# Patient Record
Sex: Female | Born: 1975
Health system: Southern US, Community
[De-identification: ages and names within clinical notes are randomized; demographics above are authoritative.]

## PROBLEM LIST (undated history)

## (undated) DIAGNOSIS — R5383 Other fatigue: Secondary | ICD-10-CM

## (undated) DIAGNOSIS — T8859XA Other complications of anesthesia, initial encounter: Secondary | ICD-10-CM

## (undated) DIAGNOSIS — L409 Psoriasis, unspecified: Secondary | ICD-10-CM

## (undated) DIAGNOSIS — T4145XA Adverse effect of unspecified anesthetic, initial encounter: Secondary | ICD-10-CM

## (undated) DIAGNOSIS — O24419 Gestational diabetes mellitus in pregnancy, unspecified control: Secondary | ICD-10-CM

## (undated) DIAGNOSIS — Z8742 Personal history of other diseases of the female genital tract: Secondary | ICD-10-CM

## (undated) DIAGNOSIS — E282 Polycystic ovarian syndrome: Secondary | ICD-10-CM

## (undated) DIAGNOSIS — N939 Abnormal uterine and vaginal bleeding, unspecified: Secondary | ICD-10-CM

## (undated) DIAGNOSIS — E669 Obesity, unspecified: Secondary | ICD-10-CM

## (undated) DIAGNOSIS — M1990 Unspecified osteoarthritis, unspecified site: Secondary | ICD-10-CM

## (undated) DIAGNOSIS — G971 Other reaction to spinal and lumbar puncture: Secondary | ICD-10-CM

## (undated) DIAGNOSIS — R1032 Left lower quadrant pain: Secondary | ICD-10-CM

## (undated) HISTORY — DX: Psoriasis, unspecified: L40.9

## (undated) HISTORY — DX: Other fatigue: R53.83

## (undated) HISTORY — PX: TONSILLECTOMY: SUR1361

## (undated) HISTORY — DX: Unspecified osteoarthritis, unspecified site: M19.90

## (undated) HISTORY — DX: Left lower quadrant pain: R10.32

## (undated) HISTORY — DX: Polycystic ovarian syndrome: E28.2

## (undated) HISTORY — DX: Personal history of other diseases of the female genital tract: Z87.42

## (undated) HISTORY — DX: Obesity, unspecified: E66.9

## (undated) HISTORY — DX: Abnormal uterine and vaginal bleeding, unspecified: N93.9

---

## 1995-11-12 HISTORY — PX: LAPAROSCOPY FOR ECTOPIC PREGNANCY: SUR765

## 1998-11-11 HISTORY — PX: CARPAL TUNNEL RELEASE: SHX101

## 1999-08-22 ENCOUNTER — Ambulatory Visit (HOSPITAL_BASED_OUTPATIENT_CLINIC_OR_DEPARTMENT_OTHER): Admission: RE | Admit: 1999-08-22 | Discharge: 1999-08-22 | Payer: Self-pay | Admitting: Orthopedic Surgery

## 2001-07-06 ENCOUNTER — Encounter: Payer: Self-pay | Admitting: Internal Medicine

## 2001-07-06 ENCOUNTER — Ambulatory Visit (HOSPITAL_COMMUNITY): Admission: RE | Admit: 2001-07-06 | Discharge: 2001-07-06 | Payer: Self-pay | Admitting: Internal Medicine

## 2001-07-06 ENCOUNTER — Encounter: Payer: Self-pay | Admitting: Emergency Medicine

## 2001-07-06 ENCOUNTER — Emergency Department (HOSPITAL_COMMUNITY): Admission: EM | Admit: 2001-07-06 | Discharge: 2001-07-06 | Payer: Self-pay | Admitting: Emergency Medicine

## 2001-07-08 ENCOUNTER — Ambulatory Visit (HOSPITAL_COMMUNITY): Admission: RE | Admit: 2001-07-08 | Discharge: 2001-07-08 | Payer: Self-pay | Admitting: Internal Medicine

## 2001-07-08 ENCOUNTER — Encounter: Payer: Self-pay | Admitting: Internal Medicine

## 2002-11-05 ENCOUNTER — Encounter: Payer: Self-pay | Admitting: Family Medicine

## 2002-11-05 ENCOUNTER — Ambulatory Visit (HOSPITAL_COMMUNITY): Admission: RE | Admit: 2002-11-05 | Discharge: 2002-11-05 | Payer: Self-pay | Admitting: Family Medicine

## 2003-01-18 ENCOUNTER — Other Ambulatory Visit: Admission: RE | Admit: 2003-01-18 | Discharge: 2003-01-18 | Payer: Self-pay | Admitting: *Deleted

## 2005-11-11 HISTORY — PX: OTHER SURGICAL HISTORY: SHX169

## 2006-09-09 ENCOUNTER — Ambulatory Visit (HOSPITAL_COMMUNITY): Admission: RE | Admit: 2006-09-09 | Discharge: 2006-09-09 | Payer: Self-pay | Admitting: Family Medicine

## 2007-04-27 ENCOUNTER — Ambulatory Visit (HOSPITAL_COMMUNITY): Admission: RE | Admit: 2007-04-27 | Discharge: 2007-04-27 | Payer: Self-pay | Admitting: Family Medicine

## 2007-06-11 ENCOUNTER — Emergency Department (HOSPITAL_COMMUNITY): Admission: EM | Admit: 2007-06-11 | Discharge: 2007-06-11 | Payer: Self-pay | Admitting: Emergency Medicine

## 2007-06-22 ENCOUNTER — Ambulatory Visit (HOSPITAL_COMMUNITY): Admission: RE | Admit: 2007-06-22 | Discharge: 2007-06-22 | Payer: Self-pay | Admitting: Internal Medicine

## 2007-10-13 ENCOUNTER — Encounter: Admission: RE | Admit: 2007-10-13 | Discharge: 2007-10-13 | Payer: Self-pay | Admitting: Rheumatology

## 2008-06-29 ENCOUNTER — Encounter: Admission: RE | Admit: 2008-06-29 | Discharge: 2008-06-29 | Payer: Self-pay | Admitting: Rheumatology

## 2008-09-09 ENCOUNTER — Encounter (INDEPENDENT_AMBULATORY_CARE_PROVIDER_SITE_OTHER): Payer: Self-pay | Admitting: General Surgery

## 2008-09-09 ENCOUNTER — Ambulatory Visit (HOSPITAL_COMMUNITY): Admission: RE | Admit: 2008-09-09 | Discharge: 2008-09-09 | Payer: Self-pay | Admitting: General Surgery

## 2010-09-04 ENCOUNTER — Ambulatory Visit (HOSPITAL_COMMUNITY): Admission: RE | Admit: 2010-09-04 | Discharge: 2010-09-04 | Payer: Self-pay | Admitting: Family Medicine

## 2010-09-10 ENCOUNTER — Encounter (HOSPITAL_COMMUNITY)
Admission: RE | Admit: 2010-09-10 | Discharge: 2010-10-10 | Payer: Self-pay | Source: Home / Self Care | Admitting: Family Medicine

## 2011-03-26 NOTE — H&P (Signed)
NAME:  Emily Love, Emily Love NO.:  [id number]   MEDICAL RECORD NO.:  [id number]          PATIENT TYPE:  AMB   LOCATION:  DAY                           FACILITY:  [facility]   PHYSICIAN:  Tilford Pillar, MD      DATE OF BIRTH:  1976/09/16   DATE OF ADMISSION:  DATE OF DISCHARGE:  LH                              HISTORY & PHYSICAL   CHIEF COMPLAINTS:  Swollen lymph nodes.   HISTORY OF PRESENT ILLNESS:  The patient is a 35 year old female who was  referred to my office after several-month history of nodularity within  her neck.  She first noted this back in May.  She has had no significant  change.  She did state a slight decrease in the largest nodule on the  right side of her neck with the administration of antibiotics, but soon  increased back to its current size following that.  She has had no fever  and chills and she has no significant change in her weight.  She is  easily fatigued but has had similar symptoms in the past associated with  her psoriatic arthritis as well as her suspected rheumatoid arthritis.  She has had no change in bowel or bladder habits.  She has no family  history of any hematologic disorders.  She denied any other nodularities  in axilla or groin.  She has noted no changes in her breast, no masses  that she has noted.   PAST MEDICAL HISTORY:  1. Psoriatic arthritis.  2. Increased rheumatoid factor.   PAST SURGICAL HISTORY:  She has had a carpal tunnel.  She has had 2  cesarean sections.  She has had tonsils and adenoidectomy.   MEDICATIONS:  She is currently on doxycycline and Septra.  She is  prescribed Enbrel and methotrexate, which she has not taken for over a  month secondary to the lymphadenopathy.  She is on methocarbamol 3 times  p.o. daily as needed.   ALLERGIES:  Penicillin, Biaxin, Humira, and Prilosec CR.   SOCIAL HISTORY:  No tobacco, no alcohol use.  No recreational drug use.   OCCUPATION:  She does work as a Land.   PERTINENT FAMILY HISTORY:  Persistent diabetes mellitus and thyroid  disease.  She has had no history of hematologic diseases or cancers.   REVIEW OF SYSTEMS:  CONSTITUTIONAL:  Headaches.  EYES:  Blurred vision, diplopia pain.  EARS, NOSE, AND THROAT:  Unremarkable.  RESPIRATORY:  Unremarkable.  CARDIOVASCULAR:  Unremarkable.  GASTROINTESTINAL:  Indigestion.  GENITOURINARY:  Unremarkable.  MUSCULOSKELETAL:  Arthralgias to the neck, back, and joints.  SKIN:  Rash and pruritus.  To note, she has also noted a increased in  the overlying rash in the posterior aspect of her neck soon after the  first lymph node was noted.  ENDOCRINE:  She complains of increased thirst and lack of energy.  NEUROLOGIC:  Unremarkable.   PHYSICAL EXAMINATION:  GENERAL:  The patient is obese, calm-appearing  female in no acute distress.  She is alert and oriented x3.  HEENT:  Scalp no deformities, no changes with the hair, no  brittleness  of the hair, no scalp masses.  Eyes Pupils are equal, round, reactive.  Extraocular movements are intact.  No conjunctival pallor is noted.  Oral mucosa is pink.  Normal occlusion.  It is moist.  NECK:  Trachea is midline.  No thyroid nodularity or goiters noted.  She  does have cervical adenopathy in the posterior aspect of the right side  of her neck as well as along the upper portions of her posterior neck.  I do not appreciate any significant supraclavicular nodularity.  PULMONARY:  Unlabored respiration.  No wheezes.  She is clear to  auscultation bilaterally.  CARDIOVASCULAR:  Regular rate and rhythm.  No murmurs are apparent.  AXILLA:  On evaluation to the axilla, no axillary lymphadenopathy is  appreciated.  ABDOMEN:  Positive bowel sounds.  Abdomen is soft, nontender.  No  hernias.  No masses.  No groin adenopathy is noted.  SKIN:  Warm and dry.   ASSESSMENT AND PLAN:  Lymphadenopathy.  At this time, my suspicion is  that she has a cancerous  etiology of her enlarged lymph nodes is  relatively low.  However, due to the long duration of these and the lack  of decrease with medication, I did discuss with the patient the  recommendations for biopsy of one of these nodes.  The risks, benefits,  and alternatives of lymph node biopsy were discussed at length with the  patient including bleeding and infection.  Her questions and concerns  were addressed and does wish to proceed.  In addition, the patient has  had a previous exposure to the steroids approximately 2 months ago.  However due to this exposure, I did discuss with the patient that she  will require a steroid taper following the operation.  She understands  and again wishes to proceed.      Tilford Pillar, MD  Electronically Signed     BZ/MEDQ  D:  08/23/2008  T:  08/24/2008  Job:  [id number]   cc:   Corrie Mckusick, M.D.  Fax: [phone]

## 2011-03-26 NOTE — Op Note (Signed)
NAME:  Emily Love, Emily Love NO.:  [id number]   MEDICAL RECORD NO.:  [id number]          PATIENT TYPE:  AMB   LOCATION:  DAY                           FACILITY:  [facility]   PHYSICIAN:  Tilford Pillar, MD      DATE OF BIRTH:  1976-10-15   DATE OF PROCEDURE:  09/09/3008  DATE OF DISCHARGE:                               OPERATIVE REPORT   PREOPERATIVE DIAGNOSIS:  Lymphadenopathy.   POSTOPERATIVE DIAGNOSIS:  Lymphadenopathy.   PROCEDURE:  Right cervical lymph node biopsy via 1.5-cm incision.   SURGEON:  Tilford Pillar, MD   ANESTHESIA:  General via laryngeal mask airway, local anesthetic 0.5%  Marcaine plain.   SPECIMEN:  Lymph node.   ESTIMATED BLOOD LOSS:  Minimal.   INDICATIONS:  The patient is a 35 year old female with a history of  rheumatoid arthritis and psoriasis, who presented with a history of  several nodules in her neck.  These have been present for several months  now with no significant change in size.  Although on evaluation, my  suspicion that these were  malignant in nature, was quite low, the  persistent course did make a lymph node biopsy recommendation.  The  risks, benefits, and alternatives of a lymph node biopsy were discussed  at length with the patient including, but not limited to the risk of  bleeding, infection, and local paresthesia.  The patient's questions and  concerns were addressed.  The patient was consented for the planned  procedure.   OPERATION:  The patient was taken to the operating room and was placed  in supine position on operating room table, at which time the general  anesthesia was administered.  Once the patient was asleep, laryngeal  mask airway was placed by Anesthesia.  At this time, she was positioned  with her right arm retracted inferiorly and her head rotated to the  left.  This allowed adequate exposure of the palpable lymph node.  Chlorhexidine solution was utilized for prep and sterile drapes were  placed.  A  skin incision was created along the line of her hairline with  a scalpel.  Additional dissection down the subcuticular tissue was  carried out using a needle tip electrocautery.  The palpable lymph node  was identified.  This was dissected free.  Additional lymph node was  also identified and this appeared to be somewhat enlarged, but appeared  relatively normal on its gross appearance.  This was sent with the other  specimen.  These were both sent as fresh specimen to Pathology.  Upon  dissecting the pedicle of the lymph node, some bleeding was encountered.  A small vessel was identified.  Electrocautery did not obtain sufficient  hemostasis.  Therefore, a 3-0 silk suture was utilized to suture ligate  the vessels.  At this point, the wound was irrigated.  A 3-0 Vicryl  suture was utilized to reapproximate the deep subcuticular tissue and  then a 4-0 Monocryl was utilized to reapproximate the skin edges in a  running subcuticular suture and then local anesthetic was instilled.  The skin was washed, dried with moist dry towel.  Benzoin was applied  around incision.  Half-inch Steri-Strips were placed.  Drapes were  removed.  The patient was allowed to come out of the general anesthetic  and was transferred back to regular hospital room in a stable condition.   At the conclusion of the procedure all instruments, sponge, and needle  counts were correct.  The patient tolerated the procedure well.      Tilford Pillar, MD  Electronically Signed     BZ/MEDQ  D:  09/09/2008  T:  09/10/2008  Job:  [id number]   cc:   Corrie Mckusick, M.D.  Fax: [phone]

## 2011-06-10 IMAGING — NM NM HEPATO W/GB/PHARM/[PERSON_NAME]
2 series · 12 of 12 positions shown · non-contrast
Comparison: Ultrasound 09/04/2010

CLINICAL DATA: Abdominal pain

NUCLEAR MEDICINE HEPATOBILIARY IMAGING WITH GALLBLADDER EF
TECHNIQUE: Sequential images of the abdomen were obtained [DATE]
minutes following intravenous administration of
radiopharmaceutical.  After slow intravenous infusion of 1.19 ucg
Cholecystokinin, gallbladder ejection fraction was determined.
Radiopharmaceutical:  5.6 mCi Gc-VVm Choletec

[Series 1: gb hepatobiliary scan · 3.19mm/px · 6 of 30 frames shown (1 of 2)]
[frame 3/30]
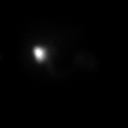
[frame 8/30]
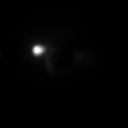
[frame 13/30]
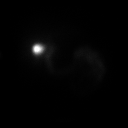
[frame 18/30]
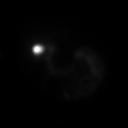
[frame 23/30]
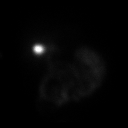
[frame 28/30]
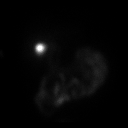

[Series 1: gb hepatobiliary scan · 3.19mm/px · 6 of 60 frames shown (2 of 2)]
[frame 6/60]
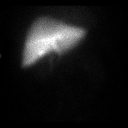
[frame 16/60]
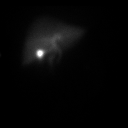
[frame 26/60]
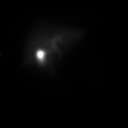
[frame 36/60]
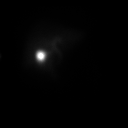
[frame 46/60]
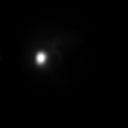
[frame 56/60]
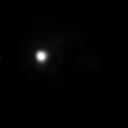

[12 of 12 positions shown; findings below may reference images not displayed]

FINDINGS: There is prompt extraction of radiotracer from the blood
pool and homogeneous uptake within the liver.  The gallbladder is
evident by 15 minutes.  Counts are present within the bowel by 45
minutes.  Upon administration of a cholecystokinin, the gallbladder
contracts appropriately with a calculated ejection fraction = 59%
at 30 min (Normal greater than 30 % ejection).
IMPRESSION: 1. Normal gallbladder ejection fraction =  59%.
2. Patent cystic duct and common bile duct.

## 2011-08-12 LAB — CBC
HCT: 45.5
Hemoglobin: 15.5 — ABNORMAL HIGH
WBC: 10.6 — ABNORMAL HIGH

## 2011-08-12 LAB — BASIC METABOLIC PANEL
BUN: 6
CO2: 29
GFR calc Af Amer: >60

## 2012-03-24 ENCOUNTER — Other Ambulatory Visit (HOSPITAL_COMMUNITY): Payer: Self-pay | Admitting: Internal Medicine

## 2012-03-24 DIAGNOSIS — N643 Galactorrhea not associated with childbirth: Secondary | ICD-10-CM

## 2012-03-27 ENCOUNTER — Ambulatory Visit (HOSPITAL_COMMUNITY)
Admission: RE | Admit: 2012-03-27 | Discharge: 2012-03-27 | Disposition: A | Discharge status: Home / Self Care | Payer: Self-pay | Source: Ambulatory Visit | Attending: Internal Medicine | Admitting: Internal Medicine

## 2012-03-27 DIAGNOSIS — R51 Headache: Secondary | ICD-10-CM | POA: Insufficient documentation

## 2012-03-27 DIAGNOSIS — N643 Galactorrhea not associated with childbirth: Secondary | ICD-10-CM | POA: Insufficient documentation

## 2012-03-27 DIAGNOSIS — H538 Other visual disturbances: Secondary | ICD-10-CM | POA: Insufficient documentation

## 2015-01-26 ENCOUNTER — Encounter: Payer: Self-pay | Admitting: Adult Health

## 2015-01-26 ENCOUNTER — Ambulatory Visit (INDEPENDENT_AMBULATORY_CARE_PROVIDER_SITE_OTHER): Payer: BLUE CROSS/BLUE SHIELD | Admitting: Adult Health

## 2015-01-26 VITALS — BP 128/78 | HR 100 | Ht 61.5 in | Wt 237.0 lb

## 2015-01-26 DIAGNOSIS — R5383 Other fatigue: Secondary | ICD-10-CM

## 2015-01-26 DIAGNOSIS — Z8742 Personal history of other diseases of the female genital tract: Secondary | ICD-10-CM

## 2015-01-26 DIAGNOSIS — N939 Abnormal uterine and vaginal bleeding, unspecified: Secondary | ICD-10-CM | POA: Diagnosis not present

## 2015-01-26 DIAGNOSIS — R1032 Left lower quadrant pain: Secondary | ICD-10-CM | POA: Diagnosis not present

## 2015-01-26 HISTORY — DX: Personal history of other diseases of the female genital tract: Z87.42

## 2015-01-26 HISTORY — DX: Abnormal uterine and vaginal bleeding, unspecified: N93.9

## 2015-01-26 HISTORY — DX: Other fatigue: R53.83

## 2015-01-26 HISTORY — DX: Left lower quadrant pain: R10.32

## 2015-01-26 LAB — POCT URINALYSIS DIPSTICK
GLUCOSE UA: NEGATIVE
LEUKOCYTES UA: NEGATIVE
NITRITE UA: NEGATIVE
PROTEIN UA: NEGATIVE

## 2015-01-26 LAB — POCT URINE PREGNANCY: PREG TEST UR: NEGATIVE

## 2015-01-26 NOTE — Patient Instructions (Signed)
Polycystic Ovarian Syndrome Polycystic ovarian syndrome (PCOS) is a common hormonal disorder among women of reproductive age. Most women with PCOS grow many small cysts on their ovaries. PCOS can cause problems with your periods and make it difficult to get pregnant. It can also cause an increased risk of miscarriage with pregnancy. If left untreated, PCOS can lead to serious health problems, such as diabetes and heart disease. CAUSES The cause of PCOS is not fully understood, but genetics may be a factor. SIGNS AND SYMPTOMS   Infrequent or no menstrual periods.   Inability to get pregnant (infertility) because of not ovulating.   Increased growth of hair on the face, chest, stomach, back, thumbs, thighs, or toes.   Acne, oily skin, or dandruff.   Pelvic pain.   Weight gain or obesity, usually carrying extra weight around the waist.   Type 2 diabetes.   High cholesterol.   High blood pressure.   Female-pattern baldness or thinning hair.   Patches of thickened and dark brown or black skin on the neck, arms, breasts, or thighs.   Tiny excess flaps of skin (skin tags) in the armpits or neck area.   Excessive snoring and having breathing stop at times while asleep (sleep apnea).   Deepening of the voice.   Gestational diabetes when pregnant.  DIAGNOSIS  There is no single test to diagnose PCOS.   Your health care provider will:   Take a medical history.   Perform a pelvic exam.   Have ultrasonography done.   Check your female and female hormone levels.   Measure glucose or sugar levels in the blood.   Do other blood tests.   If you are producing too many female hormones, your health care provider will make sure it is from PCOS. At the physical exam, your health care provider will want to evaluate the areas of increased hair growth. Try to allow natural hair growth for a few days before the visit.   During a pelvic exam, the ovaries may be enlarged  or swollen because of the increased number of small cysts. This can be seen more easily by using vaginal ultrasonography or screening to examine the ovaries and lining of the uterus (endometrium) for cysts. The uterine lining may become thicker if you have not been having a regular period.  TREATMENT  Because there is no cure for PCOS, it needs to be managed to prevent problems. Treatments are based on your symptoms. Treatment is also based on whether you want to have a baby or whether you need contraception.  Treatment may include:   Progesterone hormone to start a menstrual period.   Birth control pills to make you have regular menstrual periods.   Medicines to make you ovulate, if you want to get pregnant.   Medicines to control your insulin.   Medicine to control your blood pressure.   Medicine and diet to control your high cholesterol and triglycerides in your blood.  Medicine to reduce excessive hair growth.  Surgery, making small holes in the ovary, to decrease the amount of female hormone production. This is done through a long, lighted tube (laparoscope) placed into the pelvis through a tiny incision in the lower abdomen.  HOME CARE INSTRUCTIONS  Only take over-the-counter or prescription medicine as directed by your health care provider.  Pay attention to the foods you eat and your activity levels. This can help reduce the effects of PCOS.  Keep your weight under control.  Eat foods that are   low in carbohydrate and high in fiber.  Exercise regularly. SEEK MEDICAL CARE IF:  Your symptoms do not get better with medicine.  You have new symptoms. Document Released: 02/21/2005 Document Revised: 08/18/2013 Document Reviewed: 04/15/2013 [facility] Patient Information 2015 [place]. This information is not intended to replace advice given to you by your health care provider. Make sure you discuss any questions you have with your health care provider. Dysfunctional  Uterine Bleeding Normally, menstrual periods begin between ages 9511 to 5717 in young women. A normal menstrual cycle/period may begin every 23 days up to 35 days and lasts from 1 to 7 days. Around 12 to 14 days before your menstrual period starts, ovulation (ovary produces an egg) occurs. When counting the time between menstrual periods, count from the first day of bleeding of the previous period to the first day of bleeding of the next period. Dysfunctional (abnormal) uterine bleeding is bleeding that is different from a normal menstrual period. Your periods may come earlier or later than usual. They may be lighter, have blood clots or be heavier. You may have bleeding between periods, or you may skip one period or more. You may have bleeding after sexual intercourse, bleeding after menopause, or no menstrual period. CAUSES   Pregnancy (normal, miscarriage, tubal).  IUDs (intrauterine device, birth control).  Birth control pills.  Hormone treatment.  Menopause.  Infection of the cervix.  Blood clotting problems.  Infection of the inside lining of the uterus.  Endometriosis, inside lining of the uterus growing in the pelvis and other female organs.  Adhesions (scar tissue) inside the uterus.  Obesity or severe weight loss.  Uterine polyps inside the uterus.  Cancer of the vagina, cervix, or uterus.  Ovarian cysts or polycystic ovary syndrome.  Medical problems (diabetes, thyroid disease).  Uterine fibroids (noncancerous tumor).  Problems with your female hormones.  Endometrial hyperplasia, very thick lining and enlarged cells inside of the uterus.  Medicines that interfere with ovulation.  Radiation to the pelvis or abdomen.  Chemotherapy. DIAGNOSIS   Your doctor will discuss the history of your menstrual periods, medicines you are taking, changes in your weight, stress in your life, and any medical problems you may have.  Your doctor will do a physical and pelvic  examination.  Your doctor may want to perform certain tests to make a diagnosis, such as:  Pap test.  Blood tests.  Cultures for infection.  CT scan.  Ultrasound.  Hysteroscopy.  Laparoscopy.  MRI.  Hysterosalpingography.  D and C.  Endometrial biopsy. TREATMENT  Treatment will depend on the cause of the dysfunctional uterine bleeding (DUB). Treatment may include:  Observing your menstrual periods for a couple of months.  Prescribing medicines for medical problems, including:  Antibiotics.  Hormones.  Birth control pills.  Removing an IUD (intrauterine device, birth control).  Surgery:  D and C (scrape and remove tissue from inside the uterus).  Laparoscopy (examine inside the abdomen with a lighted tube).  Uterine ablation (destroy lining of the uterus with electrical current, laser, heat, or freezing).  Hysteroscopy (examine cervix and uterus with a lighted tube).  Hysterectomy (remove the uterus). HOME CARE INSTRUCTIONS   If medicines were prescribed, take exactly as directed. Do not change or switch medicines without consulting your caregiver.  Long term heavy bleeding may result in iron deficiency. Your caregiver may have prescribed iron pills. They help replace the iron that your body lost from heavy bleeding. Take exactly as directed.  Do not take aspirin  or medicines that contain aspirin one week before or during your menstrual period. Aspirin may make the bleeding worse.  If you need to change your sanitary pad or tampon more than once every 2 hours, stay in bed with your feet elevated and a cold pack on your lower abdomen. Rest as much as possible, until the bleeding stops or slows down.  Eat well-balanced meals. Eat foods high in iron. Examples are:  Leafy green vegetables.  Whole-grain breads and cereals.  Eggs.  Meat.  Liver.  Do not try to lose weight until the abnormal bleeding has stopped and your blood iron level is back to  normal. Do not lift more than ten pounds or do strenuous activities when you are bleeding.  For a couple of months, make note on your calendar, marking the start and ending of your period, and the type of bleeding (light, medium, heavy, spotting, clots or missed periods). This is for your caregiver to better evaluate your problem. SEEK MEDICAL CARE IF:   You develop nausea (feeling sick to your stomach) and vomiting, dizziness, or diarrhea while you are taking your medicine.  You are getting lightheaded or weak.  You have any problems that may be related to the medicine you are taking.  You develop pain with your DUB.  You want to remove your IUD.  You want to stop or change your birth control pills or hormones.  You have any type of abnormal bleeding mentioned above.  You are over 35 years old and have not had a menstrual period yet.  You are 39 years old and you are still having menstrual periods.  You have any of the symptoms mentioned above.  You develop a rash. SEEK IMMEDIATE MEDICAL CARE IF:   An oral temperature above 102 F (38.9 C) develops.  You develop chills.  You are changing your sanitary pad or tampon more than once an hour.  You develop abdominal pain.  You pass out or faint. Document Released: 10/25/2000 Document Revised: 01/20/2012 Document Reviewed: 09/26/2009 [facility] Patient Information 2015 [place], [place]. This information is not intended to replace advice given to you by your health care provider. Make sure you discuss any questions you have with your health care provider. return next week for [place] and 2 weeks for pap and physical

## 2015-01-26 NOTE — Progress Notes (Signed)
Subjective:     Patient ID: Emily CaterAlicia L Love, female   DOB: Sep 14, 1976, 39 y.o.   MRN: [id number]  HPI Emily Love is a 39 year old white female, married in complaining of bleeding since Friday, has been heavy at times with clots and cramps, but not bleeding now.Has pain LLQ and back.Has history of PCO and last period 2 years ago.Has increased hair growth and hair loss, is hot at night and has fatigue and sex hurts.Has been on OCs in past and stopped and tried metformin and it made her sick.She has 2 boys by C section and has had 1 SAB and 1 ectopic.  Review of Systems Patient denies any daily headaches, hearing loss,  blurred vision, shortness of breath, chest pain, abdominal pain, problems with bowel movements,or urination.Has  joint pains, has arthritis, no  mood swings. See HPI for positives.  Reviewed past medical,surgical, social and family history. Reviewed medications and allergies.     Objective:   Physical Exam BP 128/78 mmHg  Pulse 100  Ht 5' 1.5" (1.562 m)  Wt 237 lb (107.502 kg)  BMI 44.06 kg/m2  LMP 01/20/2015 UPT negative, urine trace blood, Skin warm and dry,has psoriasis.Pelvic: external genitalia is normal in appearance no lesions, vagina: no discharge or blood,urethra has no lesions or masses noted, cervix:smooth, uterus: normal size, shape and contour, mildly tender, no masses felt, adnexa: no masses, LLQ tenderness noted,but some tenderness across pelvic area. . Bladder is non tender and no masses felt. No CVAT but some low back tenderness noted.    Assessment:     LLQ pain  AUB History of PCO Fatigue     Plan:    GC/CGL sent Check CBC,CMP,TSH and FSH Return next week for gyn US Return in 2 weeks for pap and physical Review handout on AUB and PCO.

## 2015-01-27 ENCOUNTER — Telehealth: Payer: Self-pay | Admitting: Adult Health

## 2015-01-27 LAB — COMPREHENSIVE METABOLIC PANEL
A/G RATIO: 2.2 (ref 1.1–2.5)
ALBUMIN: 4.6 g/dL (ref 3.5–5.5)
ALK PHOS: 79 IU/L (ref 39–117)
ALT: 18 IU/L (ref 0–32)
AST: 17 IU/L (ref 0–40)
BILIRUBIN TOTAL: 0.2 mg/dL (ref 0.0–1.2)
BUN/Creatinine Ratio: 13 (ref 8–20)
BUN: 10 mg/dL (ref 6–20)
CALCIUM: 10 mg/dL (ref 8.7–10.2)
CHLORIDE: 101 mmol/L (ref 97–108)
CO2: 23 mmol/L (ref 18–29)
CREATININE: 0.79 mg/dL (ref 0.57–1.00)
GFR calc non Af Amer: 95 mL/min/{1.73_m2} (ref >59)
GFR, EST AFRICAN AMERICAN: 109 mL/min/{1.73_m2} (ref >59)
Globulin, Total: 2.1 g/dL (ref 1.5–4.5)
Glucose: 94 mg/dL (ref 65–99)
POTASSIUM: 4.4 mmol/L (ref 3.5–5.2)
SODIUM: 140 mmol/L (ref 134–144)
TOTAL PROTEIN: 6.7 g/dL (ref 6.0–8.5)

## 2015-01-27 LAB — CBC
HEMATOCRIT: 43.5 % (ref 34.0–46.6)
HEMOGLOBIN: 15 g/dL (ref 11.1–15.9)
MCH: 30.4 pg (ref 26.6–33.0)
MCHC: 34.5 g/dL (ref 31.5–35.7)
MCV: 88 fL (ref 79–97)
Platelets: 332 10*3/uL (ref 150–379)
RBC: 4.94 x10E6/uL (ref 3.77–5.28)
RDW: 14 % (ref 12.3–15.4)
WBC: 13.1 10*3/uL — AB (ref 3.4–10.8)

## 2015-01-27 LAB — TSH: TSH: 2.72 u[IU]/mL (ref 0.450–4.500)

## 2015-01-27 LAB — FOLLICLE STIMULATING HORMONE: FSH: 9.2 m[IU]/mL

## 2015-01-27 NOTE — Telephone Encounter (Signed)
Left message labs normal

## 2015-01-28 LAB — GC/CHLAMYDIA PROBE AMP
Chlamydia trachomatis, NAA: NEGATIVE
Neisseria gonorrhoeae by PCR: NEGATIVE

## 2015-02-01 ENCOUNTER — Other Ambulatory Visit: Payer: Self-pay | Admitting: Adult Health

## 2015-02-01 ENCOUNTER — Ambulatory Visit (INDEPENDENT_AMBULATORY_CARE_PROVIDER_SITE_OTHER): Payer: BLUE CROSS/BLUE SHIELD

## 2015-02-01 DIAGNOSIS — Z8742 Personal history of other diseases of the female genital tract: Secondary | ICD-10-CM

## 2015-02-01 DIAGNOSIS — N939 Abnormal uterine and vaginal bleeding, unspecified: Secondary | ICD-10-CM

## 2015-02-01 DIAGNOSIS — R1032 Left lower quadrant pain: Secondary | ICD-10-CM

## 2015-02-09 ENCOUNTER — Other Ambulatory Visit (HOSPITAL_COMMUNITY)
Admission: RE | Admit: 2015-02-09 | Discharge: 2015-02-09 | Disposition: A | Discharge status: Home / Self Care | Payer: BLUE CROSS/BLUE SHIELD | Source: Ambulatory Visit | Attending: Adult Health | Admitting: Adult Health

## 2015-02-09 ENCOUNTER — Encounter: Payer: Self-pay | Admitting: Adult Health

## 2015-02-09 ENCOUNTER — Ambulatory Visit (INDEPENDENT_AMBULATORY_CARE_PROVIDER_SITE_OTHER): Payer: BLUE CROSS/BLUE SHIELD | Admitting: Adult Health

## 2015-02-09 VITALS — BP 122/78 | HR 108 | Ht 61.5 in | Wt 236.5 lb

## 2015-02-09 DIAGNOSIS — Z01419 Encounter for gynecological examination (general) (routine) without abnormal findings: Secondary | ICD-10-CM

## 2015-02-09 DIAGNOSIS — Z1151 Encounter for screening for human papillomavirus (HPV): Secondary | ICD-10-CM | POA: Diagnosis present

## 2015-02-09 NOTE — Progress Notes (Signed)
Patient ID: Emily Love, female   DOB: January 30, 1976, 39 y.o.   MRN: [id number] History of Present Illness: Emily Love is a 39 year old white female in for well woman gyn exam and pap.She has had AUB and it finally stopped, and the gyn US was normal, no PCO noted.   Current Medications, Allergies, Past Medical History, Past Surgical History, Family History and Social History were [facility].     Review of Systems: Patient denies any headaches, hearing loss, fatigue, blurred vision, shortness of breath, chest pain, abdominal pain, problems with bowel movements, urination, or intercourse. No joint pain or mood swings.Has had LLQ pain and AUB in past.    Physical Exam:BP 122/78 mmHg  Pulse 108  Ht 5' 1.5" (1.562 m)  Wt 236 lb 8 oz (107.276 kg)  BMI 43.97 kg/m2  LMP 01/20/2015 General:  Well developed, well nourished, no acute distress Skin:  Warm and dry, has psoriasis  Neck:  Midline trachea, normal thyroid, good ROM, no lymphadenopathy Lungs; Clear to auscultation bilaterally Breast:  No dominant palpable mass, retraction, or nipple discharge, some tenderness right breast  Cardiovascular: Regular rate and rhythm Abdomen:  Soft, non tender, no hepatosplenomegaly Pelvic:  External genitalia is normal in appearance, no lesions.  The vagina is normal in appearance. Urethra has no lesions or masses. The cervix is bulbous. Pap with HPV performed. Uterus is felt to be normal size, shape, and contour, mildly tender.  No adnexal masses or tenderness noted.Bladder is non tender, no masses felt. Extremities/musculoskeletal:  No swelling or varicosities noted, no clubbing or cyanosis Psych:  No mood changes, alert and cooperative,seems happy She says she has bad clots with OCs and does not want to try.Discussed megace or micronor or IUD or ablation as options.  Impression: Well woman gyn exam with pap  Plan: Review handout on endo ablation Call if  bleeding resumes, can try megace  Get mammogram at 39 Physical in 1 year

## 2015-02-09 NOTE — Patient Instructions (Signed)
Get mammogram at 39  Physical yearly

## 2015-02-14 LAB — CYTOLOGY - PAP

## 2016-04-11 DIAGNOSIS — L4050 Arthropathic psoriasis, unspecified: Secondary | ICD-10-CM | POA: Diagnosis not present

## 2016-04-11 DIAGNOSIS — R51 Headache: Secondary | ICD-10-CM | POA: Diagnosis not present

## 2016-04-11 DIAGNOSIS — M3500 Sicca syndrome, unspecified: Secondary | ICD-10-CM | POA: Diagnosis not present

## 2016-04-11 DIAGNOSIS — Z79899 Other long term (current) drug therapy: Secondary | ICD-10-CM | POA: Diagnosis not present

## 2016-08-28 DIAGNOSIS — L4050 Arthropathic psoriasis, unspecified: Secondary | ICD-10-CM | POA: Diagnosis not present

## 2016-08-28 DIAGNOSIS — Z79899 Other long term (current) drug therapy: Secondary | ICD-10-CM | POA: Diagnosis not present

## 2016-08-28 DIAGNOSIS — M545 Low back pain: Secondary | ICD-10-CM | POA: Diagnosis not present

## 2016-08-28 DIAGNOSIS — M549 Dorsalgia, unspecified: Secondary | ICD-10-CM | POA: Diagnosis not present

## 2016-08-28 DIAGNOSIS — M3500 Sicca syndrome, unspecified: Secondary | ICD-10-CM | POA: Diagnosis not present

## 2017-02-26 DIAGNOSIS — L4050 Arthropathic psoriasis, unspecified: Secondary | ICD-10-CM | POA: Diagnosis not present

## 2017-02-26 DIAGNOSIS — Z79899 Other long term (current) drug therapy: Secondary | ICD-10-CM | POA: Diagnosis not present

## 2017-02-26 DIAGNOSIS — M549 Dorsalgia, unspecified: Secondary | ICD-10-CM | POA: Diagnosis not present

## 2017-02-26 DIAGNOSIS — M3500 Sicca syndrome, unspecified: Secondary | ICD-10-CM | POA: Diagnosis not present

## 2017-06-16 ENCOUNTER — Encounter: Payer: Self-pay | Admitting: Adult Health

## 2017-06-16 ENCOUNTER — Ambulatory Visit (INDEPENDENT_AMBULATORY_CARE_PROVIDER_SITE_OTHER): Payer: BLUE CROSS/BLUE SHIELD | Admitting: Adult Health

## 2017-06-16 VITALS — BP 122/80 | HR 100 | Ht 61.5 in | Wt 215.0 lb

## 2017-06-16 DIAGNOSIS — N926 Irregular menstruation, unspecified: Secondary | ICD-10-CM

## 2017-06-16 DIAGNOSIS — Z3201 Encounter for pregnancy test, result positive: Secondary | ICD-10-CM

## 2017-06-16 DIAGNOSIS — O3680X0 Pregnancy with inconclusive fetal viability, not applicable or unspecified: Secondary | ICD-10-CM

## 2017-06-16 DIAGNOSIS — R110 Nausea: Secondary | ICD-10-CM

## 2017-06-16 DIAGNOSIS — O09521 Supervision of elderly multigravida, first trimester: Secondary | ICD-10-CM

## 2017-06-16 DIAGNOSIS — Z3490 Encounter for supervision of normal pregnancy, unspecified, unspecified trimester: Secondary | ICD-10-CM

## 2017-06-16 LAB — POCT URINE PREGNANCY: Preg Test, Ur: POSITIVE — AB

## 2017-06-16 NOTE — Patient Instructions (Signed)
First Trimester of Pregnancy The first trimester of pregnancy is from week 1 until the end of week 13 (months 1 through 3). A week after a sperm fertilizes an egg, the egg will implant on the wall of the uterus. This embryo will begin to develop into a baby. Genes from you and your partner will form the baby. The female genes will determine whether the baby will be a boy or a girl. At 6-8 weeks, the eyes and face will be formed, and the heartbeat can be seen on ultrasound. At the end of 12 weeks, all the baby's organs will be formed. Now that you are pregnant, you will want to do everything you can to have a healthy baby. Two of the most important things are to get good prenatal care and to follow your health care provider's instructions. Prenatal care is all the medical care you receive before the baby's birth. This care will help prevent, find, and treat any problems during the pregnancy and childbirth. Body changes during your first trimester Your body goes through many changes during pregnancy. The changes vary from woman to woman.  You may gain or lose a couple of pounds at first.  You may feel sick to your stomach (nauseous) and you may throw up (vomit). If the vomiting is uncontrollable, call your health care provider.  You may tire easily.  You may develop headaches that can be relieved by medicines. All medicines should be approved by your health care provider.  You may urinate more often. Painful urination may mean you have a bladder infection.  You may develop heartburn as a result of your pregnancy.  You may develop constipation because certain hormones are causing the muscles that push stool through your intestines to slow down.  You may develop hemorrhoids or swollen veins (varicose veins).  Your breasts may begin to grow larger and become tender. Your nipples may stick out more, and the tissue that surrounds them (areola) may become darker.  Your gums may bleed and may be  sensitive to brushing and flossing.  Dark spots or blotches (chloasma, mask of pregnancy) may develop on your face. This will likely fade after the baby is born.  Your menstrual periods will stop.  You may have a loss of appetite.  You may develop cravings for certain kinds of food.  You may have changes in your emotions from day to day, such as being excited to be pregnant or being concerned that something may go wrong with the pregnancy and baby.  You may have more vivid and strange dreams.  You may have changes in your hair. These can include thickening of your hair, rapid growth, and changes in texture. Some women also have hair loss during or after pregnancy, or hair that feels dry or thin. Your hair will most likely return to normal after your baby is born.  What to expect at prenatal visits During a routine prenatal visit:  You will be weighed to make sure you and the baby are growing normally.  Your blood pressure will be taken.  Your abdomen will be measured to track your baby's growth.  The fetal heartbeat will be listened to between weeks 10 and 14 of your pregnancy.  Test results from any previous visits will be discussed.  Your health care provider may ask you:  How you are feeling.  If you are feeling the baby move.  If you have had any abnormal symptoms, such as leaking fluid, bleeding, severe headaches,   or abdominal cramping.  If you are using any tobacco products, including cigarettes, chewing tobacco, and electronic cigarettes.  If you have any questions.  Other tests that may be performed during your first trimester include:  Blood tests to find your blood type and to check for the presence of any previous infections. The tests will also be used to check for low iron levels (anemia) and protein on red blood cells (Rh antibodies). Depending on your risk factors, or if you previously had diabetes during pregnancy, you may have tests to check for high blood  sugar that affects pregnant women (gestational diabetes).  Urine tests to check for infections, diabetes, or protein in the urine.  An ultrasound to confirm the proper growth and development of the baby.  Fetal screens for spinal cord problems (spina bifida) and Down syndrome.  HIV (human immunodeficiency virus) testing. Routine prenatal testing includes screening for HIV, unless you choose not to have this test.  You may need other tests to make sure you and the baby are doing well.  Follow these instructions at home: Medicines  Follow your health care provider's instructions regarding medicine use. Specific medicines may be either safe or unsafe to take during pregnancy.  Take a prenatal vitamin that contains at least 600 micrograms (mcg) of folic acid.  If you develop constipation, try taking a stool softener if your health care provider approves. Eating and drinking  Eat a balanced diet that includes fresh fruits and vegetables, whole grains, good sources of protein such as meat, eggs, or tofu, and low-fat dairy. Your health care provider will help you determine the amount of weight gain that is right for you.  Avoid raw meat and uncooked cheese. These carry germs that can cause birth defects in the baby.  Eating four or five small meals rather than three large meals a day may help relieve nausea and vomiting. If you start to feel nauseous, eating a few soda crackers can be helpful. Drinking liquids between meals, instead of during meals, also seems to help ease nausea and vomiting.  Limit foods that are high in fat and processed sugars, such as fried and sweet foods.  To prevent constipation: ? Eat foods that are high in fiber, such as fresh fruits and vegetables, whole grains, and beans. ? Drink enough fluid to keep your urine clear or pale yellow. Activity  Exercise only as directed by your health care provider. Most women can continue their usual exercise routine during  pregnancy. Try to exercise for 30 minutes at least 5 days a week. Exercising will help you: ? Control your weight. ? Stay in shape. ? Be prepared for labor and delivery.  Experiencing pain or cramping in the lower abdomen or lower back is a good sign that you should stop exercising. Check with your health care provider before continuing with normal exercises.  Try to avoid standing for long periods of time. Move your legs often if you must stand in one place for a long time.  Avoid heavy lifting.  Wear low-heeled shoes and practice good posture.  You may continue to have sex unless your health care provider tells you not to. Relieving pain and discomfort  Wear a good support bra to relieve breast tenderness.  Take warm sitz baths to soothe any pain or discomfort caused by hemorrhoids. Use hemorrhoid cream if your health care provider approves.  Rest with your legs elevated if you have leg cramps or low back pain.  If you develop   varicose veins in your legs, wear support hose. Elevate your feet for 15 minutes, 3-4 times a day. Limit salt in your diet. Prenatal care  Schedule your prenatal visits by the twelfth week of pregnancy. They are usually scheduled monthly at first, then more often in the last 2 months before delivery.  Write down your questions. Take them to your prenatal visits.  Keep all your prenatal visits as told by your health care provider. This is important. Safety  Wear your seat belt at all times when driving.  Make a list of emergency phone numbers, including numbers for family, friends, the hospital, and police and fire departments. General instructions  Ask your health care provider for a referral to a local prenatal education class. Begin classes no later than the beginning of month 6 of your pregnancy.  Ask for help if you have counseling or nutritional needs during pregnancy. Your health care provider can offer advice or refer you to specialists for help  with various needs.  Do not use hot tubs, steam rooms, or saunas.  Do not douche or use tampons or scented sanitary pads.  Do not cross your legs for long periods of time.  Avoid cat litter boxes and soil used by cats. These carry germs that can cause birth defects in the baby and possibly loss of the fetus by miscarriage or stillbirth.  Avoid all smoking, herbs, alcohol, and medicines not prescribed by your health care provider. Chemicals in these products affect the formation and growth of the baby.  Do not use any products that contain nicotine or tobacco, such as cigarettes and e-cigarettes. If you need help quitting, ask your health care provider. You may receive counseling support and other resources to help you quit.  Schedule a dentist appointment. At home, brush your teeth with a soft toothbrush and be gentle when you floss. Contact a health care provider if:  You have dizziness.  You have mild pelvic cramps, pelvic pressure, or nagging pain in the abdominal area.  You have persistent nausea, vomiting, or diarrhea.  You have a bad smelling vaginal discharge.  You have pain when you urinate.  You notice increased swelling in your face, hands, legs, or ankles.  You are exposed to fifth disease or chickenpox.  You are exposed to German measles (rubella) and have never had it. Get help right away if:  You have a fever.  You are leaking fluid from your vagina.  You have spotting or bleeding from your vagina.  You have severe abdominal cramping or pain.  You have rapid weight gain or loss.  You vomit blood or material that looks like coffee grounds.  You develop a severe headache.  You have shortness of breath.  You have any kind of trauma, such as from a fall or a car accident. Summary  The first trimester of pregnancy is from week 1 until the end of week 13 (months 1 through 3).  Your body goes through many changes during pregnancy. The changes vary from  woman to woman.  You will have routine prenatal visits. During those visits, your health care provider will examine you, discuss any test results you may have, and talk with you about how you are feeling. This information is not intended to replace advice given to you by your health care provider. Make sure you discuss any questions you have with your health care provider. Document Released: 10/22/2001 Document Revised: 10/09/2016 Document Reviewed: 10/09/2016 Elsevier Interactive Patient Education  2017 [employer]  

## 2017-06-16 NOTE — Progress Notes (Signed)
Subjective:     Patient ID: Emily Love, female   DOB: 09-Sep-1976, 41 y.o.   MRN: [id number]  HPI Emily Musterlicia is a 41 year old white female in for UPT, has missed a period and had 4+HPTs, and has nausea and cramping.She had loading dose for Altamease Oileraltz first  On June, non since, do not take Taltz or lasix now.She has a 7123 and 42 year old son, and this was not planned. She also has had 2 miscarriages in the past.  PCP is Dr Sherwood GamblerFusco.   Review of Systems +missed period with 4+HPT Cramping  +nausea Reviewed past medical,surgical, social and family history. Reviewed medications and allergies.     Objective:   Physical Exam BP 122/80 (BP Location: Left Arm, Patient Position: Sitting, Cuff Size: Normal)   Pulse 100   Ht 5' 1.5" (1.562 m)   Wt 215 lb (97.5 kg)   LMP 04/11/2017   BMI 39.97 kg/m UPT + about 9+3 weeks by LMP with EDD 01/17/18. Skin warm and dry. Neck: mid line trachea, normal thyroid, good ROM, no lymphadenopathy noted. Lungs: clear to ausculation bilaterally. Cardiovascular: regular rate and rhythm.Abdomen is soft and non tender. PHQ 2 score 0.    Assessment:     1. Positive pregnancy test   2. Pregnancy, unspecified gestational age   103. Encounter to determine fetal viability of pregnancy, single or unspecified fetus   4. Elderly multigravida in first trimester       Plan:    Stop lasix and taltz Return in 2 days for dating US Review handout on first trimester and by [facility] Eat often

## 2017-06-17 ENCOUNTER — Telehealth: Payer: Self-pay | Admitting: *Deleted

## 2017-06-17 NOTE — Telephone Encounter (Signed)
Patient called with complaints of dark spotting only when wiping after using the restroom. Also has urinary frequency but not able to go much, no burning but abdominal pressure near pelvic area. Please advise.

## 2017-06-18 ENCOUNTER — Ambulatory Visit (INDEPENDENT_AMBULATORY_CARE_PROVIDER_SITE_OTHER): Payer: BLUE CROSS/BLUE SHIELD

## 2017-06-18 DIAGNOSIS — O3680X0 Pregnancy with inconclusive fetal viability, not applicable or unspecified: Secondary | ICD-10-CM

## 2017-06-18 NOTE — Telephone Encounter (Signed)
Left message I called and if stil has symptoms call and get appt

## 2017-06-19 ENCOUNTER — Encounter: Payer: Self-pay | Admitting: Radiology

## 2017-06-19 NOTE — Progress Notes (Signed)
US 9+5 wks,single IUP,positive fht 174 bpm,normal ovaries bilat,1.8 x 1.6 x .7 cm subchorionic hemorrhage,crl 23.17 mm,EDD 01/16/2018

## 2017-07-03 ENCOUNTER — Ambulatory Visit (INDEPENDENT_AMBULATORY_CARE_PROVIDER_SITE_OTHER): Payer: BLUE CROSS/BLUE SHIELD | Admitting: Women's Health

## 2017-07-03 ENCOUNTER — Encounter: Payer: Self-pay | Admitting: Women's Health

## 2017-07-03 VITALS — BP 112/64 | HR 91 | Wt 215.0 lb

## 2017-07-03 DIAGNOSIS — O34219 Maternal care for unspecified type scar from previous cesarean delivery: Secondary | ICD-10-CM

## 2017-07-03 DIAGNOSIS — O09521 Supervision of elderly multigravida, first trimester: Secondary | ICD-10-CM

## 2017-07-03 DIAGNOSIS — O09529 Supervision of elderly multigravida, unspecified trimester: Secondary | ICD-10-CM | POA: Insufficient documentation

## 2017-07-03 DIAGNOSIS — Z1389 Encounter for screening for other disorder: Secondary | ICD-10-CM

## 2017-07-03 DIAGNOSIS — O0991 Supervision of high risk pregnancy, unspecified, first trimester: Secondary | ICD-10-CM

## 2017-07-03 DIAGNOSIS — Z3481 Encounter for supervision of other normal pregnancy, first trimester: Secondary | ICD-10-CM | POA: Diagnosis not present

## 2017-07-03 DIAGNOSIS — Z331 Pregnant state, incidental: Secondary | ICD-10-CM

## 2017-07-03 DIAGNOSIS — O09891 Supervision of other high risk pregnancies, first trimester: Secondary | ICD-10-CM

## 2017-07-03 DIAGNOSIS — L409 Psoriasis, unspecified: Secondary | ICD-10-CM

## 2017-07-03 DIAGNOSIS — Z3A11 11 weeks gestation of pregnancy: Secondary | ICD-10-CM

## 2017-07-03 DIAGNOSIS — O0990 Supervision of high risk pregnancy, unspecified, unspecified trimester: Secondary | ICD-10-CM | POA: Insufficient documentation

## 2017-07-03 NOTE — Progress Notes (Signed)
Subjective:  KELLYANN KASPARIAN is a 41 y.o. [id number] Caucasian female at [redacted]w[redacted]d by LMP c/w 9wk u/s, being seen today for her first obstetrical visit.  Her obstetrical history is significant for AMA- has PCOS, thought she couldn't get pregnant, periods have been regular x last year; h/o c/s x 2 for 'small pelvis', then ectopic x 1 and sab x 1. Has psoriasis and psoriatic arthritis, was on Taltz injections-last was in July.  Pregnancy history fully reviewed.  Patient reports some nausea-improving. Denies vb, cramping, uti s/s, abnormal/malodorous vag d/c, or vulvovaginal itching/irritation.  BP 112/64   Pulse 91   Wt 215 lb (97.5 kg)   LMP 04/11/2017 (Exact Date)   BMI 39.97 kg/m   HISTORY: OB History  Gravida Para Term Preterm AB Living  5 2 2   2 2   SAB TAB Ectopic Multiple Live Births  1   1   2     # Outcome Date GA Lbr Len/2nd Weight Sex Delivery Anes PTL Lv  5 Current           4 SAB 1999          3 Ectopic 1998          2 Term 08/02/93   7 lb 7 oz (3.374 kg) M CS-LTranv   LIV  1 Term 05/08/91   7 lb 10 oz (3.459 kg) M CS-LTranv   LIV     Past Medical History:  Diagnosis Date  . Abnormal uterine bleeding (AUB) 01/26/2015  . Arthritis    psoriatic  . Fatigue 01/26/2015  . History of PCOS 01/26/2015  . LLQ pain 01/26/2015  . Obesity   . PCOS (polycystic ovarian syndrome)   . Psoriasis    Past Surgical History:  Procedure Laterality Date  . CARPAL TUNNEL RELEASE Right 2000  . CESAREAN SECTION  [id number]  . endometrial biopsy    . LAPAROSCOPY FOR ECTOPIC PREGNANCY  1997  . lumph node removed  2007  . TONSILLECTOMY     Family History  Problem Relation Age of Onset  . Hypothyroidism Mother   . Diabetes Father   . Heart disease Father   . Other Father        "something wrong with bones"  . Other Maternal Grandfather        brain tumor  . Cancer Paternal Grandmother        liver  . Epilepsy Son   . Narcolepsy Son     Exam   System:     General: Well developed  & nourished, no acute distress   Skin: Warm & dry, normal coloration and turgor, no rashes   Neurologic: Alert & oriented, normal mood   Cardiovascular: Regular rate & rhythm   Respiratory: Effort & rate normal, LCTAB, acyanotic   Abdomen: Soft, non tender   Extremities: normal strength, tone  Thin prep pap smear 02/09/2015, neg w/ -HRHPV FHR: 165 via doppler   Assessment:   Pregnancy: [id number] Patient Active Problem List   Diagnosis Date Noted  . Elderly multigravida in first trimester 06/16/2017  . Pregnancy 06/16/2017  . Positive pregnancy test 06/16/2017  . LLQ pain 01/26/2015  . Abnormal uterine bleeding (AUB) 01/26/2015  . History of PCOS 01/26/2015  . Fatigue 01/26/2015    [redacted]w[redacted]d [id number] New OB visit AMA C/S x 2 PCOS Psoriasis and psoriatic arthritis  Plan:  Initial labs obtained including Informaseq w/ XY analysis Continue prenatal vitamins Problem list reviewed and updated Reviewed n/v relief measures and  warning s/s to report Reviewed recommended weight gain based on pre-gravid BMI Encouraged well-balanced diet Genetic Screening discussed: discussed nt/it and cfDNA d/t AMA, wants cfDNA, per [facility] also needs AFP so will get that at next visit Cystic fibrosis screening discussed declined Ultrasound discussed; fetal survey: requested Follow up in 4 weeks for visit and AFP CCNC completed>not sent, is not applying for preg [facility] Begin taking a 81mg  baby aspirin daily at 12 weeks of pregnancy to decrease risk of preeclampsia during pregnancy   Marge Duncans CNM, [facility] 07/03/2017 11:53 AM

## 2017-07-03 NOTE — Patient Instructions (Addendum)
Begin taking a 81mg  baby aspirin daily at 12 weeks of pregnancy (tomorrow) to decrease risk of preeclampsia during pregnancy   Nausea & Vomiting  Have saltine crackers or pretzels by your bed and eat a few bites before you raise your head out of bed in the morning  Eat small frequent meals throughout the day instead of large meals  Drink plenty of fluids throughout the day to stay hydrated, just don't drink a lot of fluids with your meals.  This can make your stomach fill up faster making you feel sick  Do not brush your teeth right after you eat  Products with real ginger are good for nausea, like ginger ale and ginger hard candy Make sure it says made with real ginger!  Sucking on sour candy like lemon heads is also good for nausea  If your prenatal vitamins make you nauseated, take them at night so you will sleep through the nausea  Sea Bands  If you feel like you need medicine for the nausea & vomiting please let us know  If you are unable to keep any fluids or food down please let us know   Constipation  Drink plenty of fluid, preferably water, throughout the day  Eat foods high in fiber such as fruits, vegetables, and grains  Exercise, such as walking, is a good way to keep your bowels regular  Drink warm fluids, especially warm prune juice, or decaf coffee  Eat a 1/2 cup of real oatmeal (not instant), 1/2 cup applesauce, and 1/2-1 cup warm prune juice every day  If needed, you may take Colace (docusate sodium) stool softener once or twice a day to help keep the stool soft. If you are pregnant, wait until you are out of your first trimester (12-14 weeks of pregnancy)  If you still are having problems with constipation, you may take Miralax once daily as needed to help keep your bowels regular.  If you are pregnant, wait until you are out of your first trimester (12-14 weeks of pregnancy)   First Trimester of Pregnancy The first trimester of pregnancy is from week 1  until the end of week 12 (months 1 through 3). A week after a sperm fertilizes an egg, the egg will implant on the wall of the uterus. This embryo will begin to develop into a baby. Genes from you and your partner are forming the baby. The female genes determine whether the baby is a boy or a girl. At 6-8 weeks, the eyes and face are formed, and the heartbeat can be seen on ultrasound. At the end of 12 weeks, all the baby's organs are formed.  Now that you are pregnant, you will want to do everything you can to have a healthy baby. Two of the most important things are to get good prenatal care and to follow your health care provider's instructions. Prenatal care is all the medical care you receive before the baby's birth. This care will help prevent, find, and treat any problems during the pregnancy and childbirth. BODY CHANGES Your body goes through many changes during pregnancy. The changes vary from woman to woman.   You may gain or lose a couple of pounds at first.  You may feel sick to your stomach (nauseous) and throw up (vomit). If the vomiting is uncontrollable, call your health care provider.  You may tire easily.  You may develop headaches that can be relieved by medicines approved by your health care provider.  You may urinate  more often. Painful urination may mean you have a bladder infection.  You may develop heartburn as a result of your pregnancy.  You may develop constipation because certain hormones are causing the muscles that push waste through your intestines to slow down.  You may develop hemorrhoids or swollen, bulging veins (varicose veins).  Your breasts may begin to grow larger and become tender. Your nipples may stick out more, and the tissue that surrounds them (areola) may become darker.  Your gums may bleed and may be sensitive to brushing and flossing.  Dark spots or blotches (chloasma, mask of pregnancy) may develop on your face. This will likely fade after the  baby is born.  Your menstrual periods will stop.  You may have a loss of appetite.  You may develop cravings for certain kinds of food.  You may have changes in your emotions from day to day, such as being excited to be pregnant or being concerned that something may go wrong with the pregnancy and baby.  You may have more vivid and strange dreams.  You may have changes in your hair. These can include thickening of your hair, rapid growth, and changes in texture. Some women also have hair loss during or after pregnancy, or hair that feels dry or thin. Your hair will most likely return to normal after your baby is born. WHAT TO EXPECT AT YOUR PRENATAL VISITS During a routine prenatal visit:  You will be weighed to make sure you and the baby are growing normally.  Your blood pressure will be taken.  Your abdomen will be measured to track your baby's growth.  The fetal heartbeat will be listened to starting around week 10 or 12 of your pregnancy.  Test results from any previous visits will be discussed. Your health care provider may ask you:  How you are feeling.  If you are feeling the baby move.  If you have had any abnormal symptoms, such as leaking fluid, bleeding, severe headaches, or abdominal cramping.  If you have any questions. Other tests that may be performed during your first trimester include:  Blood tests to find your blood type and to check for the presence of any previous infections. They will also be used to check for low iron levels (anemia) and Rh antibodies. Later in the pregnancy, blood tests for diabetes will be done along with other tests if problems develop.  Urine tests to check for infections, diabetes, or protein in the urine.  An ultrasound to confirm the proper growth and development of the baby.  An amniocentesis to check for possible genetic problems.  Fetal screens for spina bifida and Down syndrome.  You may need other tests to make sure you  and the baby are doing well. HOME CARE INSTRUCTIONS  Medicines  Follow your health care provider's instructions regarding medicine use. Specific medicines may be either safe or unsafe to take during pregnancy.  Take your prenatal vitamins as directed.  If you develop constipation, try taking a stool softener if your health care provider approves. Diet  Eat regular, well-balanced meals. Choose a variety of foods, such as meat or vegetable-based protein, fish, milk and low-fat dairy products, vegetables, fruits, and whole grain breads and cereals. Your health care provider will help you determine the amount of weight gain that is right for you.  Avoid raw meat and uncooked cheese. These carry germs that can cause birth defects in the baby.  Eating four or five small meals rather than three  large meals a day may help relieve nausea and vomiting. If you start to feel nauseous, eating a few soda crackers can be helpful. Drinking liquids between meals instead of during meals also seems to help nausea and vomiting.  If you develop constipation, eat more high-fiber foods, such as fresh vegetables or fruit and whole grains. Drink enough fluids to keep your urine clear or pale yellow. Activity and Exercise  Exercise only as directed by your health care provider. Exercising will help you:  Control your weight.  Stay in shape.  Be prepared for labor and delivery.  Experiencing pain or cramping in the lower abdomen or low back is a good sign that you should stop exercising. Check with your health care provider before continuing normal exercises.  Try to avoid standing for long periods of time. Move your legs often if you must stand in one place for a long time.  Avoid heavy lifting.  Wear low-heeled shoes, and practice good posture.  You may continue to have sex unless your health care provider directs you otherwise. Relief of Pain or Discomfort  Wear a good support bra for breast  tenderness.   Take warm sitz baths to soothe any pain or discomfort caused by hemorrhoids. Use hemorrhoid cream if your health care provider approves.   Rest with your legs elevated if you have leg cramps or low back pain.  If you develop varicose veins in your legs, wear support hose. Elevate your feet for 15 minutes, 3-4 times a day. Limit salt in your diet. Prenatal Care  Schedule your prenatal visits by the twelfth week of pregnancy. They are usually scheduled monthly at first, then more often in the last 2 months before delivery.  Write down your questions. Take them to your prenatal visits.  Keep all your prenatal visits as directed by your health care provider. Safety  Wear your seat belt at all times when driving.  Make a list of emergency phone numbers, including numbers for family, friends, the hospital, and police and fire departments. General Tips  Ask your health care provider for a referral to a local prenatal education class. Begin classes no later than at the beginning of month 6 of your pregnancy.  Ask for help if you have counseling or nutritional needs during pregnancy. Your health care provider can offer advice or refer you to specialists for help with various needs.  Do not use hot tubs, steam rooms, or saunas.  Do not douche or use tampons or scented sanitary pads.  Do not cross your legs for long periods of time.  Avoid cat litter boxes and soil used by cats. These carry germs that can cause birth defects in the baby and possibly loss of the fetus by miscarriage or stillbirth.  Avoid all smoking, herbs, alcohol, and medicines not prescribed by your health care provider. Chemicals in these affect the formation and growth of the baby.  Schedule a dentist appointment. At home, brush your teeth with a soft toothbrush and be gentle when you floss. SEEK MEDICAL CARE IF:   You have dizziness.  You have mild pelvic cramps, pelvic pressure, or nagging pain in  the abdominal area.  You have persistent nausea, vomiting, or diarrhea.  You have a bad smelling vaginal discharge.  You have pain with urination.  You notice increased swelling in your face, hands, legs, or ankles. SEEK IMMEDIATE MEDICAL CARE IF:   You have a fever.  You are leaking fluid from your vagina.  You  have spotting or bleeding from your vagina.  You have severe abdominal cramping or pain.  You have rapid weight gain or loss.  You vomit blood or material that looks like coffee grounds.  You are exposed to German measles and have never had them.  You are exposed to fifth disease or chickenpox.  You develop a severe headache.  You have shortness of breath.  You have any kind of trauma, such as from a fall or a car accident. Document Released: 10/22/2001 Document Revised: 03/14/2014 Document Reviewed: 09/07/2013 Exit[employer] Patient Information 2015 [employer]. This information is not intended to replace advice given to you by your health care provider. Make sure you discuss any questions you have with your health care provider.   

## 2017-07-04 ENCOUNTER — Encounter: Payer: Self-pay | Admitting: Women's Health

## 2017-07-04 DIAGNOSIS — Z2839 Other underimmunization status: Secondary | ICD-10-CM | POA: Insufficient documentation

## 2017-07-04 DIAGNOSIS — Z283 Underimmunization status: Secondary | ICD-10-CM | POA: Insufficient documentation

## 2017-07-04 DIAGNOSIS — O09899 Supervision of other high risk pregnancies, unspecified trimester: Secondary | ICD-10-CM | POA: Insufficient documentation

## 2017-07-04 DIAGNOSIS — O9989 Other specified diseases and conditions complicating pregnancy, childbirth and the puerperium: Secondary | ICD-10-CM

## 2017-07-04 LAB — URINALYSIS, ROUTINE W REFLEX MICROSCOPIC
BILIRUBIN UA: NEGATIVE
Glucose, UA: NEGATIVE
KETONES UA: NEGATIVE
LEUKOCYTES UA: NEGATIVE
Nitrite, UA: NEGATIVE
Protein, UA: NEGATIVE
RBC UA: NEGATIVE
SPEC GRAV UA: 1.005 (ref 1.005–1.030)
Urobilinogen, Ur: 0.2 mg/dL (ref 0.2–1.0)
pH, UA: 8 — ABNORMAL HIGH (ref 5.0–7.5)

## 2017-07-04 LAB — PMP SCREEN PROFILE (10S), URINE
AMPHETAMINE SCREEN URINE: NEGATIVE ng/mL
BARBITURATE SCREEN URINE: NEGATIVE ng/mL
BENZODIAZEPINE SCREEN, URINE: NEGATIVE ng/mL
CANNABINOIDS UR QL SCN: NEGATIVE ng/mL
COCAINE(METAB.)SCREEN, URINE: NEGATIVE ng/mL
Creatinine(Crt), U: 26.2 mg/dL (ref 20.0–300.0)
Methadone Screen, Urine: NEGATIVE ng/mL
OPIATE SCREEN URINE: NEGATIVE ng/mL
OXYCODONE+OXYMORPHONE UR QL SCN: NEGATIVE ng/mL
PHENCYCLIDINE QUANTITATIVE URINE: NEGATIVE ng/mL
Ph of Urine: 7.2 (ref 4.5–8.9)
Propoxyphene Scrn, Ur: NEGATIVE ng/mL

## 2017-07-04 LAB — CBC
HEMATOCRIT: 40.2 % (ref 34.0–46.6)
HEMOGLOBIN: 13.3 g/dL (ref 11.1–15.9)
MCH: 29.2 pg (ref 26.6–33.0)
MCHC: 33.1 g/dL (ref 31.5–35.7)
MCV: 88 fL (ref 79–97)
Platelets: 332 10*3/uL (ref 150–379)
RBC: 4.56 x10E6/uL (ref 3.77–5.28)
RDW: 13.7 % (ref 12.3–15.4)
WBC: 11.2 10*3/uL — ABNORMAL HIGH (ref 3.4–10.8)

## 2017-07-04 LAB — HEPATITIS B SURFACE ANTIGEN: Hepatitis B Surface Ag: NEGATIVE

## 2017-07-04 LAB — ABO/RH: Rh Factor: POSITIVE

## 2017-07-04 LAB — HIV ANTIBODY (ROUTINE TESTING W REFLEX): HIV SCREEN 4TH GENERATION: NONREACTIVE

## 2017-07-04 LAB — RPR: RPR Ser Ql: NONREACTIVE

## 2017-07-04 LAB — VARICELLA ZOSTER ANTIBODY, IGG: Varicella zoster IgG: 2304 index (ref >165)

## 2017-07-04 LAB — MED LIST OPTION NOT SELECTED

## 2017-07-04 LAB — RUBELLA SCREEN: Rubella Antibodies, IGG: <0.9 index — ABNORMAL LOW (ref >0.99)

## 2017-07-04 LAB — ANTIBODY SCREEN: ANTIBODY SCREEN: NEGATIVE

## 2017-07-05 LAB — URINE CULTURE

## 2017-07-05 LAB — GC/CHLAMYDIA PROBE AMP
Chlamydia trachomatis, NAA: NEGATIVE
Neisseria gonorrhoeae by PCR: NEGATIVE

## 2017-07-11 ENCOUNTER — Telehealth: Payer: Self-pay | Admitting: Advanced Practice Midwife

## 2017-07-11 LAB — INFORMASEQ(SM) WITH XY ANALYSIS
Fetal Fraction (%):: 7.4
Gestational Age at Collection: 11.6 weeks
Weight: 215 [lb_av]

## 2017-07-11 NOTE — Telephone Encounter (Signed)
Patient called stating that she would like to know the results of her blood work. Pt states she seen on her mychart that they are ready. Pt states that she will be in and out of her house to day and we are able to leave a message on her cell phone if she does not pick up.

## 2017-07-11 NOTE — Telephone Encounter (Signed)
Pt called requesting results from previous labwork. Informed pt that she was not immune to rubella and that she would be offered a vaccine for that after delivery. Pt also requested results from [facility]. Informed pt that results were normal and showed a female. Pt verbalized understanding

## 2017-07-25 ENCOUNTER — Telehealth: Payer: Self-pay | Admitting: *Deleted

## 2017-07-25 NOTE — Telephone Encounter (Signed)
Called patient to inform her its too early for antibiotic. If not improving after 7-10d from onset of sx, or continuing to worsen- needs appt. Can use mucinex d. Can try humidifier and saline nasal spray for nasal congestion  Regular robitussin, cough drops if cough  Warm salt water gargles if sore throat  Mucinex with lots of water to help you cough up the mucous in your chest if needed  Drink plenty of fluids and stay hydrated!  Wash your hands frequently.   Verbalized understanding. Has appointment next Thursday.

## 2017-07-25 NOTE — Telephone Encounter (Signed)
Patient called with sinus pressure, headache since yesterday.  Taken Sudafed but with no relief, low grade temp 99.2. Wants an antibiotic called in. Please advise. Also wants to take Mucinex but has "mucinex d" that ok?

## 2017-07-31 ENCOUNTER — Ambulatory Visit (INDEPENDENT_AMBULATORY_CARE_PROVIDER_SITE_OTHER): Payer: BLUE CROSS/BLUE SHIELD | Admitting: Advanced Practice Midwife

## 2017-07-31 ENCOUNTER — Encounter: Payer: Self-pay | Admitting: Advanced Practice Midwife

## 2017-07-31 VITALS — BP 130/60 | HR 96 | Wt 214.0 lb

## 2017-07-31 DIAGNOSIS — Z3A15 15 weeks gestation of pregnancy: Secondary | ICD-10-CM

## 2017-07-31 DIAGNOSIS — Z1389 Encounter for screening for other disorder: Secondary | ICD-10-CM

## 2017-07-31 DIAGNOSIS — Z363 Encounter for antenatal screening for malformations: Secondary | ICD-10-CM

## 2017-07-31 DIAGNOSIS — O0992 Supervision of high risk pregnancy, unspecified, second trimester: Secondary | ICD-10-CM

## 2017-07-31 DIAGNOSIS — Z3482 Encounter for supervision of other normal pregnancy, second trimester: Secondary | ICD-10-CM

## 2017-07-31 DIAGNOSIS — Z331 Pregnant state, incidental: Secondary | ICD-10-CM

## 2017-07-31 DIAGNOSIS — O09522 Supervision of elderly multigravida, second trimester: Secondary | ICD-10-CM

## 2017-07-31 LAB — POCT URINALYSIS DIPSTICK
Blood, UA: NEGATIVE
GLUCOSE UA: NEGATIVE
Glucose, UA: NEGATIVE
Ketones, UA: NEGATIVE
NITRITE UA: NEGATIVE
PROTEIN UA: NEGATIVE
Protein, UA: NEGATIVE

## 2017-07-31 MED ORDER — LIDOCAINE 5 % EX PTCH: 1.0000 | MEDICATED_PATCH | CUTANEOUS | 0 refills | Status: DC

## 2017-07-31 MED ORDER — CLINDAMYCIN HCL 300 MG PO CAPS: 300.0000 mg | ORAL_CAPSULE | Freq: Three times a day (TID) | ORAL | 0 refills | Status: DC

## 2017-07-31 MED ORDER — BUTALBITAL-APAP-CAFFEINE 50-325-40 MG PO TABS: 1.0000 | ORAL_TABLET | Freq: Four times a day (QID) | ORAL | 0 refills | Status: DC | PRN

## 2017-07-31 NOTE — Progress Notes (Signed)
Fetal Surveillance Testing today:  doppler   High Risk Pregnancy Diagnosis(es):   AMA  [id number] [redacted]w[redacted]d Estimated Date of Delivery: 01/16/18  Blood pressure 130/60, pulse 96, weight 214 lb (97.1 kg), last menstrual period 04/11/2017.  Urinalysis: Negative   HPI: The patient is being seen today for ongoing management of the above. Today she reports daily HA for the past 2 weeks, has tried the recommend stuff w/o relief. HA is in back of head.  ALso URI sx for  1 week, usually has to take clindamycin  :Lungs w/bil exp wheeze, mild.  Trigger point on middle of upper back along spine. Lidocaine patch placed   BP weight and urine results all reviewed and noted. Patient reports good fetal movement, denies any bleeding and no rupture of membranes symptoms or regular contractions.   Fetal Heart rate:  150 Edema:  no  Patient is without complaints other than noted in her HPI. All questions were answered.  All lab and sonogram results have been reviewed. Comments:  CfDNA normal XY, get AFP today  Assessment:  1.  Pregnancy at [redacted]w[redacted]d,  Estimated Date of Delivery: 01/16/18 :                          2.  AMA                        3.    Medication(s) Plans:  ASA , clindamycin, fioricet,lidoderm patch  Treatment Plan:  Growth [place] q 4 weeks after 20 weeks, Twice wekly NST at 36 weeks, IOL 40 weeks  Return in about 4 weeks (around 08/28/2017) for HROB, [id number]. for appointment for high risk OB care  Meds ordered this encounter  Medications  . aspirin EC 81 MG tablet    Sig: Take 81 mg by mouth daily.  Marland Kitchen MAGNESIUM PO    Sig: Take 500 mg by mouth 2 (two) times daily.  . Coenzyme Q10 (CO Q 10 PO)    Sig: Take 100 mg by mouth 3 (three) times daily.  . Riboflavin (VITAMIN B-2 PO)    Sig: Take 100 mg by mouth 2 (two) times daily.  . pseudoephedrine-guaifenesin (MUCINEX D) 60-600 MG 12 hr tablet    Sig: Take 1 tablet by mouth daily.   Orders Placed This Encounter  Procedures  . US OB  Comp + 14 Wk  . AFP TETRA  . POCT Urinalysis Dipstick

## 2017-07-31 NOTE — Patient Instructions (Signed)

## 2017-08-20 DIAGNOSIS — Z3482 Encounter for supervision of other normal pregnancy, second trimester: Secondary | ICD-10-CM | POA: Diagnosis not present

## 2017-08-25 LAB — AFP TETRA
DIA Mom Value: 1.55
DIA Value (EIA): 220.45 pg/mL
DSR (BY AGE) 1 IN: 41
DSR (SECOND TRIMESTER) 1 IN: 368
Gestational Age: 15.6 WEEKS
MATERNAL AGE AT EDD: 42.8 a
MSAFP MOM: 1.46
MSAFP: 36.3 ng/mL
MSHCG Mom: 0.38
MSHCG: 12839 m[IU]/mL
OSB RISK: 3048
T18 (By Age): 1:158 {titer}
Test Results:: NEGATIVE
WEIGHT: 214 [lb_av]
uE3 Mom: 1.81
uE3 Value: 1.11 ng/mL

## 2017-08-26 ENCOUNTER — Telehealth: Payer: Self-pay | Admitting: Advanced Practice Midwife

## 2017-08-26 NOTE — Telephone Encounter (Signed)
Pt called requesting results of AFP. Informed pt that results were negative.

## 2017-08-29 ENCOUNTER — Ambulatory Visit (INDEPENDENT_AMBULATORY_CARE_PROVIDER_SITE_OTHER): Payer: BLUE CROSS/BLUE SHIELD

## 2017-08-29 ENCOUNTER — Ambulatory Visit (INDEPENDENT_AMBULATORY_CARE_PROVIDER_SITE_OTHER): Payer: BLUE CROSS/BLUE SHIELD | Admitting: Women's Health

## 2017-08-29 ENCOUNTER — Encounter: Payer: Self-pay | Admitting: Women's Health

## 2017-08-29 VITALS — BP 134/76 | HR 92 | Wt 219.0 lb

## 2017-08-29 DIAGNOSIS — O0992 Supervision of high risk pregnancy, unspecified, second trimester: Secondary | ICD-10-CM

## 2017-08-29 DIAGNOSIS — Z363 Encounter for antenatal screening for malformations: Secondary | ICD-10-CM | POA: Diagnosis not present

## 2017-08-29 DIAGNOSIS — Z23 Encounter for immunization: Secondary | ICD-10-CM | POA: Diagnosis not present

## 2017-08-29 DIAGNOSIS — O0990 Supervision of high risk pregnancy, unspecified, unspecified trimester: Secondary | ICD-10-CM

## 2017-08-29 DIAGNOSIS — O09522 Supervision of elderly multigravida, second trimester: Secondary | ICD-10-CM

## 2017-08-29 DIAGNOSIS — Z331 Pregnant state, incidental: Secondary | ICD-10-CM

## 2017-08-29 DIAGNOSIS — O34219 Maternal care for unspecified type scar from previous cesarean delivery: Secondary | ICD-10-CM

## 2017-08-29 DIAGNOSIS — Z1389 Encounter for screening for other disorder: Secondary | ICD-10-CM

## 2017-08-29 DIAGNOSIS — Z3A20 20 weeks gestation of pregnancy: Secondary | ICD-10-CM

## 2017-08-29 LAB — POCT URINALYSIS DIPSTICK
Blood, UA: NEGATIVE
GLUCOSE UA: NEGATIVE
KETONES UA: NEGATIVE
LEUKOCYTES UA: NEGATIVE
Nitrite, UA: NEGATIVE
Protein, UA: NEGATIVE

## 2017-08-29 NOTE — Patient Instructions (Addendum)
[place] Pediatricians/Family Doctors:  [facility] [phone]            [facility] [phone]                 [facility] [phone] (usually not accepting new patients unless you have family there already, you are always welcome to call and ask)       [facility] [phone]       [place] Pediatricians/Family Doctors:   [facility]: [phone]  [facility]: [phone]   Second Trimester of Pregnancy The second trimester is from week 14 through week 27 (months 4 through 6). The second trimester is often a time when you feel your best. Your body has adjusted to being pregnant, and you begin to feel better physically. Usually, morning sickness has lessened or quit completely, you may have more energy, and you may have an increase in appetite. The second trimester is also a time when the fetus is growing rapidly. At the end of the sixth month, the fetus is about 9 inches long and weighs about 1 pounds. You will likely begin to feel the baby move (quickening) between 16 and 20 weeks of pregnancy. Body changes during your second trimester Your body continues to go through many changes during your second trimester. The changes vary from woman to woman.  Your weight will continue to increase. You will notice your lower abdomen bulging out.  You may begin to get stretch marks on your hips, abdomen, and breasts.  You may develop headaches that can be relieved by medicines. The medicines should be approved by your health care provider.  You may urinate more often because the fetus is pressing on your bladder.  You may develop or continue to have heartburn as a result of your pregnancy.  You may develop constipation because certain hormones are causing the muscles that push waste through your intestines to slow down.  You may develop hemorrhoids or swollen, bulging veins (varicose  veins).  You may have back pain. This is caused by: ? Weight gain. ? Pregnancy hormones that are relaxing the joints in your pelvis. ? A shift in weight and the muscles that support your balance.  Your breasts will continue to grow and they will continue to become tender.  Your gums may bleed and may be sensitive to brushing and flossing.  Dark spots or blotches (chloasma, mask of pregnancy) may develop on your face. This will likely fade after the baby is born.  A dark line from your belly button to the pubic area (linea nigra) may appear. This will likely fade after the baby is born.  You may have changes in your hair. These can include thickening of your hair, rapid growth, and changes in texture. Some women also have hair loss during or after pregnancy, or hair that feels dry or thin. Your hair will most likely return to normal after your baby is born.  What to expect at prenatal visits During a routine prenatal visit:  You will be weighed to make sure you and the fetus are growing normally.  Your blood pressure will be taken.  Your abdomen will be measured to track your baby's growth.  The fetal heartbeat will be listened to.  Any test results from the previous visit will be discussed.  Your health care provider may ask you:  How you are feeling.  If you are feeling the baby move.  If you have had any abnormal symptoms, such   as leaking fluid, bleeding, severe headaches, or abdominal cramping.  If you are using any tobacco products, including cigarettes, chewing tobacco, and electronic cigarettes.  If you have any questions.  Other tests that may be performed during your second trimester include:  Blood tests that check for: ? Low iron levels (anemia). ? High blood sugar that affects pregnant women (gestational diabetes) between 11 and 28 weeks. ? Rh antibodies. This is to check for a protein on red blood cells (Rh factor).  Urine tests to check for infections,  diabetes, or protein in the urine.  An ultrasound to confirm the proper growth and development of the baby.  An amniocentesis to check for possible genetic problems.  Fetal screens for spina bifida and Down syndrome.  HIV (human immunodeficiency virus) testing. Routine prenatal testing includes screening for HIV, unless you choose not to have this test.  Follow these instructions at home: Medicines  Follow your health care provider's instructions regarding medicine use. Specific medicines may be either safe or unsafe to take during pregnancy.  Take a prenatal vitamin that contains at least 600 micrograms (mcg) of folic acid.  If you develop constipation, try taking a stool softener if your health care provider approves. Eating and drinking  Eat a balanced diet that includes fresh fruits and vegetables, whole grains, good sources of protein such as meat, eggs, or tofu, and low-fat dairy. Your health care provider will help you determine the amount of weight gain that is right for you.  Avoid raw meat and uncooked cheese. These carry germs that can cause birth defects in the baby.  If you have low calcium intake from food, talk to your health care provider about whether you should take a daily calcium supplement.  Limit foods that are high in fat and processed sugars, such as fried and sweet foods.  To prevent constipation: ? Drink enough fluid to keep your urine clear or pale yellow. ? Eat foods that are high in fiber, such as fresh fruits and vegetables, whole grains, and beans. Activity  Exercise only as directed by your health care provider. Most women can continue their usual exercise routine during pregnancy. Try to exercise for 30 minutes at least 5 days a week. Stop exercising if you experience uterine contractions.  Avoid heavy lifting, wear low heel shoes, and practice good posture.  A sexual relationship may be continued unless your health care provider directs you  otherwise. Relieving pain and discomfort  Wear a good support bra to prevent discomfort from breast tenderness.  Take warm sitz baths to soothe any pain or discomfort caused by hemorrhoids. Use hemorrhoid cream if your health care provider approves.  Rest with your legs elevated if you have leg cramps or low back pain.  If you develop varicose veins, wear support hose. Elevate your feet for 15 minutes, 3-4 times a day. Limit salt in your diet. Prenatal Care  Write down your questions. Take them to your prenatal visits.  Keep all your prenatal visits as told by your health care provider. This is important. Safety  Wear your seat belt at all times when driving.  Make a list of emergency phone numbers, including numbers for family, friends, the hospital, and police and fire departments. General instructions  Ask your health care provider for a referral to a local prenatal education class. Begin classes no later than the beginning of month 6 of your pregnancy.  Ask for help if you have counseling or nutritional needs during pregnancy. Your  health care provider can offer advice or refer you to specialists for help with various needs.  Do not use hot tubs, steam rooms, or saunas.  Do not douche or use tampons or scented sanitary pads.  Do not cross your legs for long periods of time.  Avoid cat litter boxes and soil used by cats. These carry germs that can cause birth defects in the baby and possibly loss of the fetus by miscarriage or stillbirth.  Avoid all smoking, herbs, alcohol, and unprescribed drugs. Chemicals in these products can affect the formation and growth of the baby.  Do not use any products that contain nicotine or tobacco, such as cigarettes and e-cigarettes. If you need help quitting, ask your health care provider.  Visit your dentist if you have not gone yet during your pregnancy. Use a soft toothbrush to brush your teeth and be gentle when you floss. Contact a  health care provider if:  You have dizziness.  You have mild pelvic cramps, pelvic pressure, or nagging pain in the abdominal area.  You have persistent nausea, vomiting, or diarrhea.  You have a bad smelling vaginal discharge.  You have pain when you urinate. Get help right away if:  You have a fever.  You are leaking fluid from your vagina.  You have spotting or bleeding from your vagina.  You have severe abdominal cramping or pain.  You have rapid weight gain or weight loss.  You have shortness of breath with chest pain.  You notice sudden or extreme swelling of your face, hands, ankles, feet, or legs.  You have not felt your baby move in over an hour.  You have severe headaches that do not go away when you take medicine.  You have vision changes. Summary  The second trimester is from week 14 through week 27 (months 4 through 6). It is also a time when the fetus is growing rapidly.  Your body goes through many changes during pregnancy. The changes vary from woman to woman.  Avoid all smoking, herbs, alcohol, and unprescribed drugs. These chemicals affect the formation and growth your baby.  Do not use any tobacco products, such as cigarettes, chewing tobacco, and e-cigarettes. If you need help quitting, ask your health care provider.  Contact your health care provider if you have any questions. Keep all prenatal visits as told by your health care provider. This is important. This information is not intended to replace advice given to you by your health care provider. Make sure you discuss any questions you have with your health care provider. Document Released: 10/22/2001 Document Revised: 04/04/2016 Document Reviewed: 12/29/2012 [employer] Interactive Patient Education  2017 [employer]

## 2017-08-29 NOTE — Progress Notes (Signed)
US 20 wks,trans head left,normal ovaries bilat,svp of fluid 5.7 cm,cx 4.3 cm,post pl gr 0,fhr 140 bpm,efw 341 g,anatomy complete,no obvious abnormalities

## 2017-08-29 NOTE — Progress Notes (Signed)
   HIGH-RISK PREGNANCY VISIT Patient name: Emily Love MRN [id number]  Date of birth: 16-Apr-1976 Chief Complaint:   High Risk Gestation (u/s today)  History of Present Illness:   Emily Love is a 41 y.o. [id number] female at 9763w0d with an Estimated Date of Delivery: 01/16/18 being seen today for ongoing management of a high-risk pregnancy complicated by AMA.  Today she reports no complaints.  . Vag. Bleeding: None.  Movement: Present. denies leaking of fluid.  Review of Systems:   Pertinent items are noted in HPI Denies abnormal vaginal discharge w/ itching/odor/irritation, headaches, visual changes, shortness of breath, chest pain, abdominal pain, severe nausea/vomiting, or problems with urination or bowel movements unless otherwise stated above. Pertinent History Reviewed:  Reviewed past medical,surgical, social, obstetrical and family history.  Reviewed problem list, medications and allergies. Physical Assessment:   Vitals:   08/29/17 1221  BP: 134/76  Pulse: 92  Weight: 219 lb (99.3 kg)  Body mass index is 40.71 kg/m.           Physical Examination:   General appearance: alert, well appearing, and in no distress  Mental status: alert, oriented to person, place, and time  Skin: warm & dry   Extremities: Edema: Trace    Cardiovascular: normal heart rate noted   Respiratory: normal respiratory effort, no distress   Abdomen: gravid, soft, non-tender   Pelvic: Cervical exam deferred         Fetal Status:     Movement: Present   Fetal Surveillance Testing today:  US 20 wks,trans head left,normal ovaries bilat,svp of fluid 5.7 cm,cx 4.3 cm,post pl gr 0,fhr 140 bpm,efw 341 g,anatomy complete,no obvious abnormalities   Results for orders placed or performed in visit on 08/29/17 (from the past 24 hour(s))  POCT urinalysis dipstick   Collection Time: 08/29/17 12:18 PM  Result Value Ref Range   Color, UA     Clarity, UA     Glucose, UA neg    Bilirubin, UA     Ketones, UA  neg    Spec Grav, UA  1.010 - 1.025   Blood, UA neg    pH, UA  5.0 - 8.0   Protein, UA neg    Urobilinogen, UA  0.2 or 1.0 E.U./dL   Nitrite, UA neg    Leukocytes, UA Negative Negative    Assessment & Plan:  1) High-risk pregnancy [id number] at 2063w0d with an Estimated Date of Delivery: 01/16/18   2) AMA, continue baby asa  Labs/procedures today: anatomy u/s, flu shot  Treatment Plan:  U/S @  24, 28, 32, 36wks    2x/wk testing nst/sono @ 36wks    Deliver @ 39wks d/t repeat c/s  Reviewed: today's normal anatomy u/s, Preterm labor symptoms and general obstetric precautions including but not limited to vaginal bleeding, contractions, leaking of fluid and fetal movement were reviewed in detail with the patient.  All questions were answered.  Follow-up: Return in about 4 weeks (around 09/26/2017) for HROB, US:EFW.  Orders Placed This Encounter  Procedures  . US OB Follow Up  . Flu Vaccine QUAD 36+ mos IM  . POCT urinalysis dipstick   Marge DuncansBooker,  Randall CNM, [facility] 08/29/2017 12:42 PM

## 2017-09-26 ENCOUNTER — Ambulatory Visit (INDEPENDENT_AMBULATORY_CARE_PROVIDER_SITE_OTHER): Payer: BLUE CROSS/BLUE SHIELD

## 2017-09-26 ENCOUNTER — Encounter: Payer: Self-pay | Admitting: Women's Health

## 2017-09-26 ENCOUNTER — Ambulatory Visit (INDEPENDENT_AMBULATORY_CARE_PROVIDER_SITE_OTHER): Payer: BLUE CROSS/BLUE SHIELD | Admitting: Women's Health

## 2017-09-26 VITALS — BP 118/62 | HR 84 | Wt 223.0 lb

## 2017-09-26 DIAGNOSIS — O0992 Supervision of high risk pregnancy, unspecified, second trimester: Secondary | ICD-10-CM

## 2017-09-26 DIAGNOSIS — O09522 Supervision of elderly multigravida, second trimester: Secondary | ICD-10-CM

## 2017-09-26 DIAGNOSIS — Z3A24 24 weeks gestation of pregnancy: Secondary | ICD-10-CM

## 2017-09-26 DIAGNOSIS — Z1389 Encounter for screening for other disorder: Secondary | ICD-10-CM

## 2017-09-26 DIAGNOSIS — Z331 Pregnant state, incidental: Secondary | ICD-10-CM

## 2017-09-26 LAB — POCT URINALYSIS DIPSTICK
Blood, UA: NEGATIVE
Glucose, UA: NEGATIVE
Ketones, UA: NEGATIVE
Leukocytes, UA: NEGATIVE
Nitrite, UA: NEGATIVE
Protein, UA: NEGATIVE

## 2017-09-26 NOTE — Progress Notes (Addendum)
   HIGH-RISK PREGNANCY VISIT Patient name: Emily Love MRN [id number]  Date of birth: 1976-11-02 Chief Complaint:   High Risk Gestation (US today; + pressure)  History of Present Illness:   Emily Love is a 41 y.o. [id number] female at 6272w0d with an Estimated Date of Delivery: 01/16/18 being seen today for ongoing management of a high-risk pregnancy complicated by AMA, 41yo.  Today she reports occ pressure. Denies uti s/s, vaginal discharge/itching/odor/irritation. Contractions: Not present. Vag. Bleeding: None.  Movement: Present. denies leaking of fluid.  Review of Systems:   Pertinent items are noted in HPI Denies abnormal vaginal discharge w/ itching/odor/irritation, headaches, visual changes, shortness of breath, chest pain, abdominal pain, severe nausea/vomiting, or problems with urination or bowel movements unless otherwise stated above. Pertinent History Reviewed:  Reviewed past medical,surgical, social, obstetrical and family history.  Reviewed problem list, medications and allergies. Physical Assessment:   Vitals:   09/26/17 1236  BP: 118/62  Pulse: 84  Weight: 223 lb (101.2 kg)  Body mass index is 41.45 kg/m.           Physical Examination:   General appearance: alert, well appearing, and in no distress  Mental status: alert, oriented to person, place, and time  Skin: warm & dry   Extremities: Edema: Trace    Cardiovascular: normal heart rate noted  Respiratory: normal respiratory effort, no distress  Abdomen: gravid, soft, non-tender  Pelvic: Cervical exam deferred         Fetal Status: Fetal Heart Rate (bpm): +u/s Fundal Height: 15 cm from U, FH from SP 30cm, Movement: Present    Fetal Surveillance Testing today: u/s Per Lyla Sonarrie: EFW 53%, no abnormalities visualized   Results for orders placed or performed in visit on 09/26/17 (from the past 24 hour(s))  POCT urinalysis dipstick   Collection Time: 09/26/17 12:38 PM  Result Value Ref Range   Color, UA       Clarity, UA     Glucose, UA neg    Bilirubin, UA     Ketones, UA neg    Spec Grav, UA  1.010 - 1.025   Blood, UA neg    pH, UA  5.0 - 8.0   Protein, UA neg    Urobilinogen, UA  0.2 or 1.0 E.U./dL   Nitrite, UA neg    Leukocytes, UA Negative Negative    Assessment & Plan:  1) High-risk pregnancy [id number] at 6372w0d with an Estimated Date of Delivery: 01/16/18   2) AMA, stable, continue baby asa  3) Pressure, no s/s ptl, uti, vag infection- can try pregnancy/maternity belt or kinesiology tape  Labs/procedures today: u/s  Treatment Plan:  U/S @  28, 32, 36wks    2x/wk testing nst/sono @ 36wks    Deliver @ 40wks   Reviewed: Preterm labor symptoms and general obstetric precautions including but not limited to vaginal bleeding, contractions, leaking of fluid and fetal movement were reviewed in detail with the patient.  All questions were answered.  Follow-up: Return in about 4 weeks (around 10/24/2017) for HROB, PN2, US:EFW.  Orders Placed This Encounter  Procedures  . POCT urinalysis dipstick   Marge DuncansBooker,  Randall CNM, [facility] 09/26/2017 12:55 PM

## 2017-09-26 NOTE — Patient Instructions (Signed)
Emily Love, I greatly value your feedback.  If you receive a survey following your visit with us today, we appreciate you taking the time to fill it out.  Thanks, Joellyn HaffKim , CNM, WHNP-BC   You will have your sugar test next visit.  Please do not eat or drink anything after midnight the night before you come, not even water.  You will be here for at least two hours.     Call the office [phone]) or [facility] if:  You begin to have strong, frequent contractions  Your water breaks.  Sometimes it is a big gush of fluid, sometimes it is just a trickle that keeps getting your panties wet or running down your legs  You have vaginal bleeding.  It is normal to have a small amount of spotting if your cervix was checked.   You don't feel your baby moving like normal.  If you don't, get you something to eat and drink and lay down and focus on feeling your baby move.   If your baby is still not moving like normal, you should call the office or [facility].  Second Trimester of Pregnancy The second trimester is from week 13 through week 28, months 4 through 6. The second trimester is often a time when you feel your best. Your body has also adjusted to being pregnant, and you begin to feel better physically. Usually, morning sickness has lessened or quit completely, you may have more energy, and you may have an increase in appetite. The second trimester is also a time when the fetus is growing rapidly. At the end of the sixth month, the fetus is about 9 inches long and weighs about 1 pounds. You will likely begin to feel the baby move (quickening) between 18 and 20 weeks of the pregnancy. BODY CHANGES Your body goes through many changes during pregnancy. The changes vary from woman to woman.   Your weight will continue to increase. You will notice your lower abdomen bulging out.  You may begin to get stretch marks on your hips, abdomen, and breasts.  You may develop  headaches that can be relieved by medicines approved by your health care provider.  You may urinate more often because the fetus is pressing on your bladder.  You may develop or continue to have heartburn as a result of your pregnancy.  You may develop constipation because certain hormones are causing the muscles that push waste through your intestines to slow down.  You may develop hemorrhoids or swollen, bulging veins (varicose veins).  You may have back pain because of the weight gain and pregnancy hormones relaxing your joints between the bones in your pelvis and as a result of a shift in weight and the muscles that support your balance.  Your breasts will continue to grow and be tender.  Your gums may bleed and may be sensitive to brushing and flossing.  Dark spots or blotches (chloasma, mask of pregnancy) may develop on your face. This will likely fade after the baby is born.  A dark line from your belly button to the pubic area (linea nigra) may appear. This will likely fade after the baby is born.  You may have changes in your hair. These can include thickening of your hair, rapid growth, and changes in texture. Some women also have hair loss during or after pregnancy, or hair that feels dry or thin. Your hair will most likely return to normal after your  baby is born. WHAT TO EXPECT AT YOUR PRENATAL VISITS During a routine prenatal visit:  You will be weighed to make sure you and the fetus are growing normally.  Your blood pressure will be taken.  Your abdomen will be measured to track your baby's growth.  The fetal heartbeat will be listened to.  Any test results from the previous visit will be discussed. Your health care provider may ask you:  How you are feeling.  If you are feeling the baby move.  If you have had any abnormal symptoms, such as leaking fluid, bleeding, severe headaches, or abdominal cramping.  If you have any questions. Other tests that may be  performed during your second trimester include:  Blood tests that check for:  Low iron levels (anemia).  Gestational diabetes (between 24 and 28 weeks).  Rh antibodies.  Urine tests to check for infections, diabetes, or protein in the urine.  An ultrasound to confirm the proper growth and development of the baby.  An amniocentesis to check for possible genetic problems.  Fetal screens for spina bifida and Down syndrome. HOME CARE INSTRUCTIONS   Avoid all smoking, herbs, alcohol, and unprescribed drugs. These chemicals affect the formation and growth of the baby.  Follow your health care provider's instructions regarding medicine use. There are medicines that are either safe or unsafe to take during pregnancy.  Exercise only as directed by your health care provider. Experiencing uterine cramps is a good sign to stop exercising.  Continue to eat regular, healthy meals.  Wear a good support bra for breast tenderness.  Do not use hot tubs, steam rooms, or saunas.  Wear your seat belt at all times when driving.  Avoid raw meat, uncooked cheese, cat litter boxes, and soil used by cats. These carry germs that can cause birth defects in the baby.  Take your prenatal vitamins.  Try taking a stool softener (if your health care provider approves) if you develop constipation. Eat more high-fiber foods, such as fresh vegetables or fruit and whole grains. Drink plenty of fluids to keep your urine clear or pale yellow.  Take warm sitz baths to soothe any pain or discomfort caused by hemorrhoids. Use hemorrhoid cream if your health care provider approves.  If you develop varicose veins, wear support hose. Elevate your feet for 15 minutes, 3-4 times a day. Limit salt in your diet.  Avoid heavy lifting, wear low heel shoes, and practice good posture.  Rest with your legs elevated if you have leg cramps or low back pain.  Visit your dentist if you have not gone yet during your pregnancy.  Use a soft toothbrush to brush your teeth and be gentle when you floss.  A sexual relationship may be continued unless your health care provider directs you otherwise.  Continue to go to all your prenatal visits as directed by your health care provider. SEEK MEDICAL CARE IF:   You have dizziness.  You have mild pelvic cramps, pelvic pressure, or nagging pain in the abdominal area.  You have persistent nausea, vomiting, or diarrhea.  You have a bad smelling vaginal discharge.  You have pain with urination. SEEK IMMEDIATE MEDICAL CARE IF:   You have a fever.  You are leaking fluid from your vagina.  You have spotting or bleeding from your vagina.  You have severe abdominal cramping or pain.  You have rapid weight gain or loss.  You have shortness of breath with chest pain.  You notice sudden or extreme   swelling of your face, hands, ankles, feet, or legs.  You have not felt your baby move in over an hour.  You have severe headaches that do not go away with medicine.  You have vision changes. Document Released: 10/22/2001 Document Revised: 11/02/2013 Document Reviewed: 12/29/2012 [facility] Patient Information 2015 [place], [place]. This information is not intended to replace advice given to you by your health care provider. Make sure you discuss any questions you have with your health care provider.

## 2017-09-26 NOTE — Progress Notes (Signed)
Growth US today at 24+0 weeks. Single, active female fetus in a cephalic presentation. Cervix is closed and measures 4.2 cm.  Amniotic fluid is subjectively normal with SVP 7.48 cm. Posterior Gr 1 placenta. Bilateral ovaries appear normal. EFW 697 g (54%) which is consistent with dating.

## 2017-10-23 ENCOUNTER — Other Ambulatory Visit: Payer: Self-pay | Admitting: Women's Health

## 2017-10-23 DIAGNOSIS — O09523 Supervision of elderly multigravida, third trimester: Secondary | ICD-10-CM

## 2017-10-24 ENCOUNTER — Encounter: Payer: Self-pay | Admitting: Women's Health

## 2017-10-24 ENCOUNTER — Other Ambulatory Visit: Payer: BLUE CROSS/BLUE SHIELD

## 2017-10-24 ENCOUNTER — Ambulatory Visit (INDEPENDENT_AMBULATORY_CARE_PROVIDER_SITE_OTHER): Payer: BLUE CROSS/BLUE SHIELD

## 2017-10-24 ENCOUNTER — Ambulatory Visit (INDEPENDENT_AMBULATORY_CARE_PROVIDER_SITE_OTHER): Payer: BLUE CROSS/BLUE SHIELD | Admitting: Women's Health

## 2017-10-24 VITALS — BP 130/60 | HR 111 | Temp 98.5°F | Wt 228.5 lb

## 2017-10-24 DIAGNOSIS — Z131 Encounter for screening for diabetes mellitus: Secondary | ICD-10-CM | POA: Diagnosis not present

## 2017-10-24 DIAGNOSIS — Z3A28 28 weeks gestation of pregnancy: Secondary | ICD-10-CM | POA: Diagnosis not present

## 2017-10-24 DIAGNOSIS — O0990 Supervision of high risk pregnancy, unspecified, unspecified trimester: Secondary | ICD-10-CM

## 2017-10-24 DIAGNOSIS — O0993 Supervision of high risk pregnancy, unspecified, third trimester: Secondary | ICD-10-CM

## 2017-10-24 DIAGNOSIS — Z1389 Encounter for screening for other disorder: Secondary | ICD-10-CM

## 2017-10-24 DIAGNOSIS — O09523 Supervision of elderly multigravida, third trimester: Secondary | ICD-10-CM | POA: Diagnosis not present

## 2017-10-24 DIAGNOSIS — Z331 Pregnant state, incidental: Secondary | ICD-10-CM

## 2017-10-24 LAB — POCT URINALYSIS DIPSTICK
Blood, UA: NEGATIVE
Glucose, UA: NEGATIVE
KETONES UA: NEGATIVE
LEUKOCYTES UA: NEGATIVE
NITRITE UA: NEGATIVE
PROTEIN UA: NEGATIVE

## 2017-10-24 NOTE — Progress Notes (Signed)
HIGH-RISK PREGNANCY VISIT Patient name: Emily Love MRN [id number]  Date of birth: [birth year] Chief Complaint:   High Risk Gestation (PN2, US today; cold symptoms x 2 weeks)  History of Present Illness:   Emily Love is a 41 y.o. [id number] female at 6175w0d with an Estimated Date of Delivery: 01/16/18 being seen today for ongoing management of a high-risk pregnancy [facility] 41[facility].  Today she reports cold sx x 2 wks, much better from when it started, now just has nasal/sinus congestion. Denies fever/chills or any other sx. Has been taking 1 sudafed daily, wants to know if she can take as directed on box. Contractions: Irregular. Vag. Bleeding: None.  Movement: Present. denies leaking of fluid.  Review of Systems:   Pertinent items are noted in HPI Denies abnormal vaginal discharge w/ itching/odor/irritation, headaches, visual changes, shortness of breath, chest pain, abdominal pain, severe nausea/vomiting, or problems with urination or bowel movements unless otherwise stated above. Pertinent History Reviewed:  Reviewed past medical,surgical, social, obstetrical and family history.  Reviewed problem list, medications and allergies. Physical Assessment:   Vitals:   10/24/17 1019  BP: 130/60  Pulse: (!) 111  Temp: 98.5 F (36.9 C)  Weight: 228 lb 8 oz (103.6 kg)  Body mass index is 42.48 kg/m.           Physical Examination:   General appearance: alert, well appearing, and in no distress  Mental status: alert, oriented to person, place, and time  Skin: warm & dry   Extremities: Edema: Mild pitting, slight indentation    Cardiovascular: normal heart rate noted  Respiratory: normal respiratory effort, no distress  Abdomen: gravid, soft, non-tender  Pelvic: Cervical exam deferred         Fetal Status: Fetal Heart Rate (bpm): 154 u/s Fundal Height: 20 cm from U Movement: Present    Fetal Surveillance Testing today: US 28 wks,frank breech,cx 5 cm,posterior fundal pl  gr 0,normal ovaries bilat,fhr 154 bpm,afi 23 cm,EFW 1432 g, AC 97%  Results for orders placed or performed in visit on 10/24/17 (from the past 24 hour(s))  POCT urinalysis dipstick   Collection Time: 10/24/17 10:21 AM  Result Value Ref Range   Color, UA     Clarity, UA     Glucose, UA neg    Bilirubin, UA     Ketones, UA neg    Spec Grav, UA  1.010 - 1.025   Blood, UA neg    pH, UA  5.0 - 8.0   Protein, UA neg    Urobilinogen, UA  0.2 or 1.0 E.U./dL   Nitrite, UA neg    Leukocytes, UA Negative Negative   Appearance     Odor      Assessment & Plan:  1) High-risk pregnancy [id number] at 5975w0d with an Estimated Date of Delivery: 01/16/18   2) AMA @ 41yo, stable, continue baby asa. EFW 77%, AC 97%, AFI 23cm  3) Resolving cold, now just congestion- can take sudafed per directions on box, humidifier- let us know if worsening  Labs/procedures today: pn2, u/s  Treatment Plan:  U/S @  32, 36wks    2x/wk testing nst/sono @ 36wks    Deliver @ 40wks   Reviewed: Preterm labor symptoms and general obstetric precautions including but not limited to vaginal bleeding, contractions, leaking of fluid and fetal movement were reviewed in detail with the patient.  Recommended Tdap at HD/PCP per CDC guidelines. All questions were answered.  Follow-up: Return in about  4 weeks (around 11/21/2017) for HROB, US:EFW.  Orders Placed This Encounter  Procedures  . US OB Follow Up  . US FETAL BPP WO NON STRESS  . US UA Cord Doppler  . POCT urinalysis dipstick   Marge DuncansBooker,  Randall CNM, [facility] 10/24/2017 11:01 AM

## 2017-10-24 NOTE — Patient Instructions (Signed)
Emily Love, I greatly value your feedback.  If you receive a survey follow[employer] Catering your visit with us today, we appreciate you taking the time to fill it out.  Thanks, Joellyn HaffKim , CNM, WHNP-BC   Call the office [phone]) or go to [facility] if:  You begin to have strong, frequent contractions  Your water breaks.  Sometimes it is a big gush of fluid, sometimes it is just a trickle that keeps getting your panties wet or running down your legs  You have vaginal bleeding.  It is normal to have a small amount of spotting if your cervix was checked.   You don't feel your baby moving like normal.  If you don't, get you something to eat and drink and lay down and focus on feeling your baby move.  You should feel at least 10 movements in 2 hours.  If you don't, you should call the office or go to [facility].    Tdap Vaccine  It is recommended that you get the Tdap vaccine during the third trimester of EACH pregnancy to help protect your baby from getting pertussis (whooping cough)  27-36 weeks is the BEST time to do this so that you can pass the protection on to your baby. During pregnancy is better than after pregnancy, but if you are unable to get it during pregnancy it will be offered at the hospital.   You can get this vaccine at the health department or your family doctor  Everyone who will be around your baby should also be up-to-date on their vaccines. Adults (who are not pregnant) only need 1 dose of Tdap during adulthood.   Third Trimester of Pregnancy The third trimester is from week 29 through week 42, months 7 through 9. The third trimester is a time when the fetus is growing rapidly. At the end of the ninth month, the fetus is about 20 inches in length and weighs 6-10 pounds.  BODY CHANGES Your body goes through many changes during pregnancy. The changes vary from woman to woman.   Your weight will continue to increase. You can expect to gain 25-35 pounds (11-16 kg)  by the end of the pregnancy.  You may begin to get stretch marks on your hips, abdomen, and breasts.  You may urinate more often because the fetus is moving lower into your pelvis and pressing on your bladder.  You may develop or continue to have heartburn as a result of your pregnancy.  You may develop constipation because certain hormones are causing the muscles that push waste through your intestines to slow down.  You may develop hemorrhoids or swollen, bulging veins (varicose veins).  You may have pelvic pain because of the weight gain and pregnancy hormones relaxing your joints between the bones in your pelvis. Backaches may result from overexertion of the muscles supporting your posture.  You may have changes in your hair. These can include thickening of your hair, rapid growth, and changes in texture. Some women also have hair loss during or after pregnancy, or hair that feels dry or thin. Your hair will most likely return to normal after your baby is born.  Your breasts will continue to grow and be tender. A yellow discharge may leak from your breasts called colostrum.  Your belly button may stick out.  You may feel short of breath because of your expanding uterus.  You may notice the fetus "dropping," or moving lower in your abdomen.  You may have a bloody  mucus discharge. This usually occurs a few days to a week before labor begins.  Your cervix becomes thin and soft (effaced) near your due date. WHAT TO EXPECT AT YOUR PRENATAL EXAMS  You will have prenatal exams every 2 weeks until week 36. Then, you will have weekly prenatal exams. During a routine prenatal visit:  You will be weighed to make sure you and the fetus are growing normally.  Your blood pressure is taken.  Your abdomen will be measured to track your baby's growth.  The fetal heartbeat will be listened to.  Any test results from the previous visit will be discussed.  You may have a cervical check near  your due date to see if you have effaced. At around 36 weeks, your caregiver will check your cervix. At the same time, your caregiver will also perform a test on the secretions of the vaginal tissue. This test is to determine if a type of bacteria, Group B streptococcus, is present. Your caregiver will explain this further. Your caregiver may ask you:  What your birth plan is.  How you are feeling.  If you are feeling the baby move.  If you have had any abnormal symptoms, such as leaking fluid, bleeding, severe headaches, or abdominal cramping.  If you have any questions. Other tests or screenings that may be performed during your third trimester include:  Blood tests that check for low iron levels (anemia).  Fetal testing to check the health, activity level, and growth of the fetus. Testing is done if you have certain medical conditions or if there are problems during the pregnancy. FALSE LABOR You may feel small, irregular contractions that eventually go away. These are called Braxton Hicks contractions, or false labor. Contractions may last for hours, days, or even weeks before true labor sets in. If contractions come at regular intervals, intensify, or become painful, it is best to be seen by your caregiver.  SIGNS OF LABOR   Menstrual-like cramps.  Contractions that are 5 minutes apart or less.  Contractions that start on the top of the uterus and spread down to the lower abdomen and back.  A sense of increased pelvic pressure or back pain.  A watery or bloody mucus discharge that comes from the vagina. If you have any of these signs before the 37th week of pregnancy, call your caregiver right away. You need to go to the hospital to get checked immediately. HOME CARE INSTRUCTIONS   Avoid all smoking, herbs, alcohol, and unprescribed drugs. These chemicals affect the formation and growth of the baby.  Follow your caregiver's instructions regarding medicine use. There are  medicines that are either safe or unsafe to take during pregnancy.  Exercise only as directed by your caregiver. Experiencing uterine cramps is a good sign to stop exercising.  Continue to eat regular, healthy meals.  Wear a good support bra for breast tenderness.  Do not use hot tubs, steam rooms, or saunas.  Wear your seat belt at all times when driving.  Avoid raw meat, uncooked cheese, cat litter boxes, and soil used by cats. These carry germs that can cause birth defects in the baby.  Take your prenatal vitamins.  Try taking a stool softener (if your caregiver approves) if you develop constipation. Eat more high-fiber foods, such as fresh vegetables or fruit and whole grains. Drink plenty of fluids to keep your urine clear or pale yellow.  Take warm sitz baths to soothe any pain or discomfort caused by hemorrhoids.  Use hemorrhoid cream if your caregiver approves.  If you develop varicose veins, wear support hose. Elevate your feet for 15 minutes, 3-4 times a day. Limit salt in your diet.  Avoid heavy lifting, wear low heal shoes, and practice good posture.  Rest a lot with your legs elevated if you have leg cramps or low back pain.  Visit your dentist if you have not gone during your pregnancy. Use a soft toothbrush to brush your teeth and be gentle when you floss.  A sexual relationship may be continued unless your caregiver directs you otherwise.  Do not travel far distances unless it is absolutely necessary and only with the approval of your caregiver.  Take prenatal classes to understand, practice, and ask questions about the labor and delivery.  Make a trial run to the hospital.  Pack your hospital bag.  Prepare the baby's nursery.  Continue to go to all your prenatal visits as directed by your caregiver. SEEK MEDICAL CARE IF:  You are unsure if you are in labor or if your water has broken.  You have dizziness.  You have mild pelvic cramps, pelvic pressure, or  nagging pain in your abdominal area.  You have persistent nausea, vomiting, or diarrhea.  You have a bad smelling vaginal discharge.  You have pain with urination. SEEK IMMEDIATE MEDICAL CARE IF:   You have a fever.  You are leaking fluid from your vagina.  You have spotting or bleeding from your vagina.  You have severe abdominal cramping or pain.  You have rapid weight loss or gain.  You have shortness of breath with chest pain.  You notice sudden or extreme swelling of your face, hands, ankles, feet, or legs.  You have not felt your baby move in over an hour.  You have severe headaches that do not go away with medicine.  You have vision changes. Document Released: 10/22/2001 Document Revised: 11/02/2013 Document Reviewed: 12/29/2012 [facility] Patient Information 2015 [place], [place]. This information is not intended to replace advice given to you by your health care provider. Make sure you discuss any questions you have with your health care provider.

## 2017-10-24 NOTE — Progress Notes (Signed)
US 28 wks,frank breech,cx 5 cm,posterior fundal pl gr 0,normal ovaries bilat,fhr 154 bpm,afi 23 cm,EFW 1432 g, AC 97%

## 2017-10-25 LAB — CBC
HEMATOCRIT: 35.3 % (ref 34.0–46.6)
Hemoglobin: 11.7 g/dL (ref 11.1–15.9)
MCH: 31 pg (ref 26.6–33.0)
MCHC: 33.1 g/dL (ref 31.5–35.7)
MCV: 93 fL (ref 79–97)
PLATELETS: 301 10*3/uL (ref 150–379)
RBC: 3.78 x10E6/uL (ref 3.77–5.28)
RDW: 13.6 % (ref 12.3–15.4)
WBC: 15.5 10*3/uL — ABNORMAL HIGH (ref 3.4–10.8)

## 2017-10-25 LAB — GLUCOSE TOLERANCE, 2 HOURS W/ 1HR
GLUCOSE, 1 HOUR: 128 mg/dL (ref 65–179)
Glucose, 2 hour: 86 mg/dL (ref 65–152)
Glucose, Fasting: 84 mg/dL (ref 65–91)

## 2017-10-25 LAB — ANTIBODY SCREEN: Antibody Screen: NEGATIVE

## 2017-10-25 LAB — HIV ANTIBODY (ROUTINE TESTING W REFLEX): HIV Screen 4th Generation wRfx: NONREACTIVE

## 2017-10-25 LAB — RPR: RPR Ser Ql: NONREACTIVE

## 2017-11-06 ENCOUNTER — Telehealth: Payer: Self-pay | Admitting: Obstetrics & Gynecology

## 2017-11-06 ENCOUNTER — Telehealth: Payer: Self-pay | Admitting: *Deleted

## 2017-11-06 MED ORDER — AZITHROMYCIN 250 MG PO TABS: ORAL_TABLET | ORAL | 0 refills | Status: DC

## 2017-11-06 NOTE — Telephone Encounter (Signed)
Pt called office, had cold when seen 12/14 and now has a full blown sinus infection, requests rx for antibiotics. Allergic to pcn. Rx zpak.  Cheral MarkerKimberly R. , CNM, WHNP-BC 11/06/2017 1:15 PM

## 2017-11-06 NOTE — Telephone Encounter (Signed)
Pt was last seen on 10/24/17. She says she had a cold at that visit and now has a full blown sinus infection. She is asking if Emily Love can send a prescription for an antibiotic. Please advise.

## 2017-11-06 NOTE — Telephone Encounter (Signed)
Informed patient prescription was sent but to let us know if she did not improve. Verbalized understanding.

## 2017-11-21 ENCOUNTER — Ambulatory Visit (INDEPENDENT_AMBULATORY_CARE_PROVIDER_SITE_OTHER): Payer: BLUE CROSS/BLUE SHIELD | Admitting: Women's Health

## 2017-11-21 ENCOUNTER — Encounter: Payer: Self-pay | Admitting: Women's Health

## 2017-11-21 ENCOUNTER — Ambulatory Visit (INDEPENDENT_AMBULATORY_CARE_PROVIDER_SITE_OTHER): Payer: BLUE CROSS/BLUE SHIELD

## 2017-11-21 ENCOUNTER — Other Ambulatory Visit: Payer: Self-pay

## 2017-11-21 VITALS — BP 130/74 | HR 109 | Wt 232.0 lb

## 2017-11-21 DIAGNOSIS — O09523 Supervision of elderly multigravida, third trimester: Secondary | ICD-10-CM | POA: Diagnosis not present

## 2017-11-21 DIAGNOSIS — Z3A32 32 weeks gestation of pregnancy: Secondary | ICD-10-CM | POA: Diagnosis not present

## 2017-11-21 DIAGNOSIS — O0993 Supervision of high risk pregnancy, unspecified, third trimester: Secondary | ICD-10-CM

## 2017-11-21 DIAGNOSIS — Z1389 Encounter for screening for other disorder: Secondary | ICD-10-CM

## 2017-11-21 DIAGNOSIS — O34219 Maternal care for unspecified type scar from previous cesarean delivery: Secondary | ICD-10-CM

## 2017-11-21 DIAGNOSIS — Z331 Pregnant state, incidental: Secondary | ICD-10-CM

## 2017-11-21 LAB — POCT URINALYSIS DIPSTICK
Blood, UA: NEGATIVE
Glucose, UA: NEGATIVE
KETONES UA: NEGATIVE
Leukocytes, UA: NEGATIVE
NITRITE UA: NEGATIVE
PROTEIN UA: NEGATIVE

## 2017-11-21 NOTE — Patient Instructions (Addendum)
Colman Cater, I greatly value your feedback.  If you receive a survey following your visit with [place] today, we appreciate you taking the time to fill it out.  Thanks, Joellyn Haff, CNM, WHNP-BC  Check blood sugars 4 times a day: in the morning before eating/drinking anything (<95) and 2 hours after eating breakfast, lunch, and supper (<120).  Bring the log with you to your next appointment   Call the office [phone]) or go to [facility] if:  You begin to have strong, frequent contractions  Your water breaks.  Sometimes it is a big gush of fluid, sometimes it is just a trickle that keeps getting your panties wet or running down your legs  You have vaginal bleeding.  It is normal to have a small amount of spotting if your cervix was checked.   You don't feel your baby moving like normal.  If you don't, get you something to eat and drink and lay down and focus on feeling your baby move.  You should feel at least 10 movements in 2 hours.  If you don't, you should call the office or go to [facility].   Constipation  Drink plenty of fluid, preferably water, throughout the day  Eat foods high in fiber such as fruits, vegetables, and grains  Exercise, such as walking, is a good way to keep your bowels regular  Drink warm fluids, especially warm prune juice, or decaf coffee  Eat a 1/2 cup of real oatmeal (not instant), 1/2 cup applesauce, and 1/2-1 cup warm prune juice every day  If needed, you may take Colace (docusate sodium) stool softener once or twice a day to help keep the stool soft. If you are pregnant, wait until you are out of your first trimester (12-14 weeks of pregnancy)  If you still are having problems with constipation, you may take Miralax once daily as needed to help keep your bowels regular.  If you are pregnant, wait until you are out of your first trimester (12-14 weeks of pregnancy)      Preterm Labor and Birth Information The normal length of a  pregnancy is 39-41 weeks. Preterm labor is when labor starts before 37 completed weeks of pregnancy. What are the risk factors for preterm labor? Preterm labor is more likely to occur in women who:  Have certain infections during pregnancy such as a bladder infection, sexually transmitted infection, or infection inside the uterus (chorioamnionitis).  Have a shorter-than-normal cervix.  Have gone into preterm labor before.  Have had surgery on their cervix.  Are younger than age 43 or older than age 34.  Are African American.  Are pregnant with twins or multiple babies (multiple gestation).  Take street drugs or smoke while pregnant.  Do not gain enough weight while pregnant.  Became pregnant shortly after having been pregnant.  What are the symptoms of preterm labor? Symptoms of preterm labor include:  Cramps similar to those that can happen during a menstrual period. The cramps may happen with diarrhea.  Pain in the abdomen or lower back.  Regular uterine contractions that may feel like tightening of the abdomen.  A feeling of increased pressure in the pelvis.  Increased watery or bloody mucus discharge from the vagina.  Water breaking (ruptured amniotic sac).  Why is it important to recognize signs of preterm labor? It is important to recognize signs of preterm labor because babies who are born prematurely may not be fully developed. This can put them at an  increased risk for:  Long-term (chronic) heart and lung problems.  Difficulty immediately after birth with regulating body systems, including blood sugar, body temperature, heart rate, and breathing rate.  Bleeding in the brain.  Cerebral palsy.  Learning difficulties.  Death.  These risks are highest for babies who are born before 34 weeks of pregnancy. How is preterm labor treated? Treatment depends on the length of your pregnancy, your condition, and the health of your baby. It may involve:  Having a  stitch (suture) placed in your cervix to prevent your cervix from opening too early (cerclage).  Taking or being given medicines, such as: ? Hormone medicines. These may be given early in pregnancy to help support the pregnancy. ? Medicine to stop contractions. ? Medicines to help mature the baby's lungs. These may be prescribed if the risk of delivery is high. ? Medicines to prevent your baby from developing cerebral palsy.  If the labor happens before 34 weeks of pregnancy, you may need to stay in the hospital. What should I do if I think I am in preterm labor? If you think that you are going into preterm labor, call your health care provider right away. How can I prevent preterm labor in future pregnancies? To increase your chance of having a full-term pregnancy:  Do not use any tobacco products, such as cigarettes, chewing tobacco, and e-cigarettes. If you need help quitting, ask your health care provider.  Do not use street drugs or medicines that have not been prescribed to you during your pregnancy.  Talk with your health care provider before taking any herbal supplements, even if you have been taking them regularly.  Make sure you gain a healthy amount of weight during your pregnancy.  Watch for infection. If you think that you might have an infection, get it checked right away.  Make sure to tell your health care provider if you have gone into preterm labor before.  This information is not intended to replace advice given to you by your health care provider. Make sure you discuss any questions you have with your health care provider. Document Released: 01/18/2004 Document Revised: 04/09/2016 Document Reviewed: 03/20/2016 Elsevier Interactive Patient Education  2018 [employer]

## 2017-11-21 NOTE — Progress Notes (Signed)
HIGH-RISK PREGNANCY VISIT Patient name: Emily Love MRN [id number]  Date of birth: 02-Dec-1975 Chief Complaint:   High Risk Gestation (u/s today)  History of Present Illness:   Emily Love is a 42 y.o. [id number] female at 6635w0d with an Estimated Date of Delivery: 01/16/18 being seen today for ongoing management of a high-risk pregnancy [facility] 41[facility].  Today she reports no complaints. Contractions: Irregular. Vag. Bleeding: None.  Movement: Present. denies leaking of fluid.  Review of Systems:   Pertinent items are noted in HPI Denies abnormal vaginal discharge w/ itching/odor/irritation, headaches, visual changes, shortness of breath, chest pain, abdominal pain, severe nausea/vomiting, or problems with urination or bowel movements unless otherwise stated above. Pertinent History Reviewed:  Reviewed past medical,surgical, social, obstetrical and family history.  Reviewed problem list, medications and allergies. Physical Assessment:   Vitals:   11/21/17 1216  BP: 130/74  Pulse: (!) 109  Weight: 232 lb (105.2 kg)  Body mass index is 43.13 kg/m.           Physical Examination:   General appearance: alert, well appearing, and in no distress  Mental status: alert, oriented to person, place, and time  Skin: warm & dry   Extremities: Edema: Mild pitting, slight indentation    Cardiovascular: normal heart rate noted  Respiratory: normal respiratory effort, no distress  Abdomen: gravid, soft, non-tender  Pelvic: Cervical exam deferred         Fetal Status: Fetal Heart Rate (bpm): 171 u/s Fundal Height: 24 cm from U Movement: Present    Fetal Surveillance Testing today: US 32 wks,cx 4.7 cm,cephalic,posterior pl gr 2,fhr 171 bpm,afi 23 cm,RI .62,.64=58%,BPP 8/8,EFW 2429 g 81%,BPD 94%,AC 99%  Results for orders placed or performed in visit on 11/21/17 (from the past 24 hour(s))  POCT urinalysis dipstick   Collection Time: 11/21/17 12:17 PM  Result Value Ref Range   Color, UA     Clarity, UA     Glucose, UA neg    Bilirubin, UA     Ketones, UA neg    Spec Grav, UA  1.010 - 1.025   Blood, UA neg    pH, UA  5.0 - 8.0   Protein, UA neg    Urobilinogen, UA  0.2 or 1.0 E.U./dL   Nitrite, UA neg    Leukocytes, UA Negative Negative   Appearance     Odor      Assessment & Plan:  1) High-risk pregnancy [id number] at 2035w0d with an Estimated Date of Delivery: 01/16/18   2) AMA @ 41yo> continue baby asa  3) AC 99% w/ AFI 23cm, overall EFW 81%, normal 2hr GTT, discussed repeating 2hr GTT or checking sugars QID, wants to check sugars. Supplies called into [facility] [place]. Discussed and gave printed info on QID testing/goals>bring log to next appt  4) Prev c/s x 2, wants repeat, will schedule w/ MD next visit to get c/s scheduled  Meds: No orders of the defined types were placed in this encounter.   Labs/procedures today: u/s  Treatment Plan:  U/S @ 36wks    2x/wk testing nst/sono @ 36wks    Deliver @ 39wks (RCS)  Reviewed: Preterm labor symptoms and general obstetric precautions including but not limited to vaginal bleeding, contractions, leaking of fluid and fetal movement were reviewed in detail with the patient.  All questions were answered.  Follow-up: Return in about 2 weeks (around 12/05/2017) for HROB w/ JVF.  Orders Placed This Encounter  Procedures  .  POCT urinalysis dipstick   Marge Duncans CNM, [facility] 11/21/2017 12:48 PM

## 2017-11-21 NOTE — Progress Notes (Signed)
US 32 wks,cx 4.7 cm,cephalic,posterior pl gr 2,fhr 171 bpm,afi 23 cm,RI .62,.64=58%,BPP 8/8,EFW 2429 g 81%,BPD 94%,AC 99%

## 2017-11-27 ENCOUNTER — Telehealth: Payer: Self-pay | Admitting: *Deleted

## 2017-11-27 NOTE — Telephone Encounter (Signed)
Pt called stating that her blood sugars had been running high. She has been checking them per Kims request. Pt states they range from 120-190. Informed pt, after speaking with Selena BattenKim, CNM, that she should come in for appt tomorrow and be placed on NST. Pt verbalized understanding and appt was given.

## 2017-11-28 ENCOUNTER — Encounter: Payer: Self-pay | Admitting: Obstetrics & Gynecology

## 2017-11-28 ENCOUNTER — Ambulatory Visit (INDEPENDENT_AMBULATORY_CARE_PROVIDER_SITE_OTHER): Payer: BLUE CROSS/BLUE SHIELD | Admitting: Obstetrics & Gynecology

## 2017-11-28 ENCOUNTER — Other Ambulatory Visit: Payer: Self-pay

## 2017-11-28 VITALS — BP 128/74 | HR 89 | Wt 235.0 lb

## 2017-11-28 DIAGNOSIS — O09523 Supervision of elderly multigravida, third trimester: Secondary | ICD-10-CM

## 2017-11-28 DIAGNOSIS — Z1389 Encounter for screening for other disorder: Secondary | ICD-10-CM

## 2017-11-28 DIAGNOSIS — Z7984 Long term (current) use of oral hypoglycemic drugs: Secondary | ICD-10-CM

## 2017-11-28 DIAGNOSIS — O0993 Supervision of high risk pregnancy, unspecified, third trimester: Secondary | ICD-10-CM

## 2017-11-28 DIAGNOSIS — Z3A33 33 weeks gestation of pregnancy: Secondary | ICD-10-CM | POA: Diagnosis not present

## 2017-11-28 DIAGNOSIS — Z331 Pregnant state, incidental: Secondary | ICD-10-CM

## 2017-11-28 DIAGNOSIS — O24419 Gestational diabetes mellitus in pregnancy, unspecified control: Secondary | ICD-10-CM | POA: Diagnosis not present

## 2017-11-28 LAB — POCT URINALYSIS DIPSTICK
Blood, UA: NEGATIVE
GLUCOSE UA: NEGATIVE
Ketones, UA: NEGATIVE
Leukocytes, UA: NEGATIVE
Nitrite, UA: NEGATIVE
Protein, UA: NEGATIVE

## 2017-11-28 MED ORDER — GLYBURIDE 2.5 MG PO TABS: ORAL_TABLET | ORAL | 3 refills | Status: DC

## 2017-11-28 NOTE — Progress Notes (Signed)
Patient ID: Emily Love, female   DOB: [birth year], 42 y.o.   MRN: [id number]   [facility] Patient name: Emily Love MRN [id number]  Date of birth: [birth year] Chief Complaint:   High Risk Gestation (NST)  History of Present Illness:   Emily Love is a 42 y.o. [id number] female at 8222w0d with an Estimated Date of Delivery: 01/16/18 being seen today for ongoing management of a high-risk pregnancy complicated by class  A2 DM. AMA Today she reports CBG are pretty high mostly fasting. Contractions: Irregular. Vag. Bleeding: None.  Movement: Present. denies leaking of fluid.  Review of Systems:   Pertinent items are noted in HPI Denies abnormal vaginal discharge w/ itching/odor/irritation, headaches, visual changes, shortness of breath, chest pain, abdominal pain, severe nausea/vomiting, or problems with urination or bowel movements unless otherwise stated above. Pertinent History Reviewed:  Reviewed past medical,surgical, social, obstetrical and family history.  Reviewed problem list, medications and allergies. Physical Assessment:   Vitals:   11/28/17 1248  BP: 128/74  Pulse: 89  Weight: 235 lb (106.6 kg)  Body mass index is 43.68 kg/m.           Physical Examination:   General appearance: alert, well appearing, and in no distress  Mental status: alert, oriented to person, place, and time  Skin: warm & dry   Extremities: Edema: Mild pitting, slight indentation    Cardiovascular: normal heart rate noted  Respiratory: normal respiratory effort, no distress  Abdomen: gravid, soft, non-tender  Pelvic: Cervical exam deferred         Fetal Status:     Movement: Present    Fetal Surveillance Testing today: Reactive NST   Results for orders placed or performed in visit on 11/28/17 (from the past 24 hour(s))  POCT urinalysis dipstick   Collection Time: 11/28/17 12:49 PM  Result Value Ref Range   Color, UA     Clarity, UA     Glucose, UA neg    Bilirubin, UA      Ketones, UA neg    Spec Grav, UA  1.010 - 1.025   Blood, UA neg    pH, UA  5.0 - 8.0   Protein, UA neg    Urobilinogen, UA  0.2 or 1.0 E.U./dL   Nitrite, UA neg    Leukocytes, UA Negative Negative   Appearance     Odor      Assessment & Plan:  1) High-risk pregnancy [id number] at 6822w0d with an Estimated Date of Delivery: 01/16/18   2) Class A2 DM, unstable, begin glyburide 2.5 mg qhs  3) AMA, stable  4) Previous C section x 2, BTL  Meds:  Meds ordered this encounter  Medications  . glyBURIDE (DIABETA) 2.5 MG tablet    Sig: Take 1 tablet at bedtime    Dispense:  30 tablet    Refill:  3    Labs/procedures today: reactive NST  Treatment Plan:  Tice weekly testing NST efw 36 weeks schedule for repeat + BTL 39 weeks  Reviewed: Preterm labor symptoms and general obstetric precautions including but not limited to vaginal bleeding, contractions, leaking of fluid and fetal movement were reviewed in detail with the patient.  All questions were answered.  Follow-up: Return in about 4 days (around 12/02/2017) for NST, HROB, with Dr Despina Hidden.  Orders Placed This Encounter  Procedures  . POCT urinalysis dipstick   Lazaro ArmsLuther H   11/28/2017 1:26 PM

## 2017-12-02 ENCOUNTER — Ambulatory Visit (INDEPENDENT_AMBULATORY_CARE_PROVIDER_SITE_OTHER): Payer: BLUE CROSS/BLUE SHIELD | Admitting: Obstetrics & Gynecology

## 2017-12-02 ENCOUNTER — Encounter: Payer: Self-pay | Admitting: Obstetrics & Gynecology

## 2017-12-02 VITALS — BP 130/80 | HR 96 | Wt 235.0 lb

## 2017-12-02 DIAGNOSIS — O09523 Supervision of elderly multigravida, third trimester: Secondary | ICD-10-CM

## 2017-12-02 DIAGNOSIS — O0990 Supervision of high risk pregnancy, unspecified, unspecified trimester: Secondary | ICD-10-CM

## 2017-12-02 DIAGNOSIS — O24410 Gestational diabetes mellitus in pregnancy, diet controlled: Secondary | ICD-10-CM | POA: Diagnosis not present

## 2017-12-02 DIAGNOSIS — Z1389 Encounter for screening for other disorder: Secondary | ICD-10-CM

## 2017-12-02 DIAGNOSIS — Z331 Pregnant state, incidental: Secondary | ICD-10-CM

## 2017-12-02 DIAGNOSIS — Z3A33 33 weeks gestation of pregnancy: Secondary | ICD-10-CM | POA: Diagnosis not present

## 2017-12-02 DIAGNOSIS — O24419 Gestational diabetes mellitus in pregnancy, unspecified control: Secondary | ICD-10-CM

## 2017-12-02 LAB — POCT URINALYSIS DIPSTICK
Glucose, UA: NEGATIVE
KETONES UA: NEGATIVE
Leukocytes, UA: NEGATIVE
Nitrite, UA: NEGATIVE
RBC UA: NEGATIVE

## 2017-12-02 NOTE — Progress Notes (Signed)
   HIGH-RISK PREGNANCY VISIT Patient name: Emily Love MRN [id number]  Date of birth: [birth year] Chief Complaint:   High Risk Gestation (NST)  History of Present Illness:   Emily Caterlicia L Sebesta is a 42 y.o. [id number] female at 3936w4d with an Estimated Date of Delivery: 01/16/18 being seen today for ongoing management of a high-risk pregnancy complicated by class  A2 DM. AMA Today she reports no complaints. Contractions: Irregular.  .  Movement: Present. denies leaking of fluid.  Review of Systems:   Pertinent items are noted in HPI Denies abnormal vaginal discharge w/ itching/odor/irritation, headaches, visual changes, shortness of breath, chest pain, abdominal pain, severe nausea/vomiting, or problems with urination or bowel movements unless otherwise stated above. Pertinent History Reviewed:  Reviewed past medical,surgical, social, obstetrical and family history.  Reviewed problem list, medications and allergies. Physical Assessment:   Vitals:   12/02/17 1133  BP: 130/80  Pulse: 96  Weight: 235 lb (106.6 kg)  Body mass index is 43.68 kg/m.           Physical Examination:   General appearance: alert, well appearing, and in no distress  Mental status: alert, oriented to person, place, and time  Skin: warm & dry   Extremities: Edema: Trace    Cardiovascular: normal heart rate noted  Respiratory: normal respiratory effort, no distress  Abdomen: gravid, soft, non-tender  Pelvic: Cervical exam deferred         Fetal Status: Fetal Heart Rate (bpm): 140 Fundal Height: 26 cm Movement: Present    Fetal Surveillance Testing today: reactive NST   Results for orders placed or performed in visit on 12/02/17 (from the past 24 hour(s))  POCT urinalysis dipstick   Collection Time: 12/02/17 11:37 AM  Result Value Ref Range   Color, UA     Clarity, UA     Glucose, UA neg    Bilirubin, UA     Ketones, UA neg    Spec Grav, UA  1.010 - 1.025   Blood, UA neg    pH, UA  5.0 - 8.0   Protein, UA trace    Urobilinogen, UA  0.2 or 1.0 E.U./dL   Nitrite, UA neg    Leukocytes, UA Negative Negative   Appearance     Odor      Assessment & Plan:  1) High-risk pregnancy [id number] at 2036w4d with an Estimated Date of Delivery: 01/16/18   2) Class A2 DM, stable, holding glyburide 2.5 as she is improving her diet  3) AMA, stable  Meds: No orders of the defined types were placed in this encounter.   Labs/procedures today: Reactive NST  Treatment Plan:  Twice weekly NST, EFW 36 weeks, induction 39 weeks  Reviewed: Term labor symptoms and general obstetric precautions including but not limited to vaginal bleeding, contractions, leaking of fluid and fetal movement were reviewed in detail with the patient.  All questions were answered.  Follow-up: Return in about 3 days (around 12/05/2017) for NST, HROB, with Dr Despina HiddenEure.  Orders Placed This Encounter  Procedures  . POCT urinalysis dipstick   Lazaro ArmsLuther H Eure 12/02/2017 12:22 PM

## 2017-12-05 ENCOUNTER — Encounter: Payer: BLUE CROSS/BLUE SHIELD | Admitting: Obstetrics and Gynecology

## 2017-12-05 ENCOUNTER — Encounter: Payer: Self-pay | Admitting: Obstetrics and Gynecology

## 2017-12-05 ENCOUNTER — Ambulatory Visit (INDEPENDENT_AMBULATORY_CARE_PROVIDER_SITE_OTHER): Payer: BLUE CROSS/BLUE SHIELD | Admitting: Obstetrics and Gynecology

## 2017-12-05 VITALS — BP 140/70 | HR 100 | Wt 235.0 lb

## 2017-12-05 DIAGNOSIS — O09523 Supervision of elderly multigravida, third trimester: Secondary | ICD-10-CM

## 2017-12-05 DIAGNOSIS — O0990 Supervision of high risk pregnancy, unspecified, unspecified trimester: Secondary | ICD-10-CM

## 2017-12-05 DIAGNOSIS — Z1389 Encounter for screening for other disorder: Secondary | ICD-10-CM

## 2017-12-05 DIAGNOSIS — Z3A34 34 weeks gestation of pregnancy: Secondary | ICD-10-CM | POA: Diagnosis not present

## 2017-12-05 DIAGNOSIS — Z331 Pregnant state, incidental: Secondary | ICD-10-CM

## 2017-12-05 DIAGNOSIS — O24410 Gestational diabetes mellitus in pregnancy, diet controlled: Secondary | ICD-10-CM

## 2017-12-05 LAB — POCT URINALYSIS DIPSTICK
Blood, UA: NEGATIVE
GLUCOSE UA: NEGATIVE
LEUKOCYTES UA: NEGATIVE
Nitrite, UA: NEGATIVE
Protein, UA: NEGATIVE

## 2017-12-05 NOTE — Progress Notes (Addendum)
HIGH-RISK PREGNANCY VISIT Patient name: Emily Love MRN [id number]  Date of birth: [birth year] Chief Complaint:   High Risk Gestation (NST; + pressure in vagina and rectum; started last pm)  History of Present Illness:   Emily Love is a 42 y.o. [id number] female at [redacted]w[redacted]d with an Estimated Date of Delivery: 01/16/18 being seen today for ongoing management of a high-risk pregnancy complicated by gestational DM, class  A2 DM.  Today she reports a lot of pressure in vagina and rectum. She is no longer using Glyburide 2.5. She states she has improved her diet with low-carb, low calorie options and seen an improvement with her blood sugars.   Contractions: Irregular. Vag. Bleeding: None.  Movement: Present. denies leaking of fluid.  Review of Systems:   Pertinent items are noted in HPI Denies abnormal vaginal discharge w/ itching/odor/irritation, headaches, visual changes, shortness of breath, chest pain, abdominal pain, severe nausea/vomiting, or problems with urination or bowel movements unless otherwise stated above. Pertinent History Reviewed:  Reviewed past medical,surgical, social, obstetrical and family history.  Reviewed problem list, medications and allergies. Physical Assessment:   Vitals:   12/05/17 1132  BP: 140/70  Pulse: 100  Weight: 235 lb (106.6 kg)  Body mass index is 43.68 kg/m.           Physical Examination:   General appearance: alert, well appearing, and in no distress, oriented to person, place, and time and normal appearing weight  Mental status: alert, oriented to person, place, and time, normal mood, behavior, speech, dress, motor activity, and thought processes  Skin: warm & dry   Extremities: Edema: Mild pitting, slight indentation    Cardiovascular: normal heart rate noted  Respiratory: normal respiratory effort, no distress  Abdomen: gravid, soft, non-tender  Pelvic: Cervical exam deferred  Dilation: Closed Effacement (%): 0    Fetal Status:  Fetal Heart Rate (bpm): 130 Fundal Height: 40 cm Movement: Present    Fetal Surveillance Testing today: NST accels to 155, baseline: 130 bpm  Results for orders placed or performed in visit on 12/05/17 (from the past 24 hour(s))  POCT urinalysis dipstick   Collection Time: 12/05/17 11:33 AM  Result Value Ref Range   Color, UA     Clarity, UA     Glucose, UA neg    Bilirubin, UA     Ketones, UA trace    Spec Grav, UA  1.010 - 1.025   Blood, UA neg    pH, UA  5.0 - 8.0   Protein, UA neg    Urobilinogen, UA  0.2 or 1.0 E.U./dL   Nitrite, UA neg    Leukocytes, UA Negative Negative   Appearance     Odor      Assessment & Plan:  1) High-risk pregnancy [id number] at [redacted]w[redacted]d with an Estimated Date of Delivery: 01/16/18   2) Class A2 DM, stable, pt controlling diet with low-carb options  3) AMA, stable  Meds: None  Labs/procedures today: NST-reactive  Treatment Plan:  Twice weekly NST, EFW 36 weeks, induction 39 weeks, Repeat C/S around March 1st @ 12:30PM with BTL jvf  Reviewed: Preterm labor symptoms and general obstetric precautions including but not limited to vaginal bleeding, contractions, leaking of fluid and fetal movement were reviewed in detail with the patient.  All questions were answered.  Follow-up: Return in about 4 days (around 12/09/2017), or if symptoms worsen or fail to improve, for HROB, NST.  Orders Placed This Encounter  Procedures  . POCT urinalysis  dipstick     By signing my name below, I, Izna Ahmed, attest that this documentation has been prepared under the direction and in the presence of Tilda Burrow,  V, MD. Electronically Signed: Redge GainerIzna Ahmed, Medical Scribe. 12/05/17. 12:10 PM.  I personally performed the services described in this documentation, which was SCRIBED in my presence. The recorded information has been reviewed and considered accurate. It has been edited as necessary during review. Tilda Burrow V , MD

## 2017-12-09 ENCOUNTER — Ambulatory Visit (INDEPENDENT_AMBULATORY_CARE_PROVIDER_SITE_OTHER): Payer: BLUE CROSS/BLUE SHIELD | Admitting: Advanced Practice Midwife

## 2017-12-09 VITALS — BP 138/70 | HR 103 | Wt 236.0 lb

## 2017-12-09 DIAGNOSIS — O0990 Supervision of high risk pregnancy, unspecified, unspecified trimester: Secondary | ICD-10-CM

## 2017-12-09 DIAGNOSIS — O09523 Supervision of elderly multigravida, third trimester: Secondary | ICD-10-CM

## 2017-12-09 DIAGNOSIS — O24410 Gestational diabetes mellitus in pregnancy, diet controlled: Secondary | ICD-10-CM

## 2017-12-09 DIAGNOSIS — Z3A34 34 weeks gestation of pregnancy: Secondary | ICD-10-CM

## 2017-12-09 DIAGNOSIS — Z331 Pregnant state, incidental: Secondary | ICD-10-CM

## 2017-12-09 DIAGNOSIS — Z1389 Encounter for screening for other disorder: Secondary | ICD-10-CM

## 2017-12-09 LAB — POCT URINALYSIS DIPSTICK
Blood, UA: NEGATIVE
GLUCOSE UA: NEGATIVE
KETONES UA: NEGATIVE
Leukocytes, UA: NEGATIVE
Nitrite, UA: NEGATIVE
Protein, UA: NEGATIVE

## 2017-12-09 NOTE — Progress Notes (Signed)
HIGH-RISK PREGNANCY VISIT Patient name: Emily Love MRN [id number]  Date of birth: [birth year] Chief Complaint:   High Risk Gestation (NST)  History of Present Illness:   Emily Love is a 42 y.o. [id number] female at 1811w4d with an Estimated Date of Delivery: 01/16/18 being seen today for ongoing management of a high-risk pregnancy complicated by gestational DM, class  A1 DM, AMA.  Today she reports no complaints. Contractions: Irregular. Vag. Bleeding: None.  Movement: Present. denies leaking of fluid.  Review of Systems:   Pertinent items are noted in HPI Denies abnormal vaginal discharge w/ itching/odor/irritation, headaches, visual changes, shortness of breath, chest pain, abdominal pain, severe nausea/vomiting, or problems with urination or bowel movements unless otherwise stated above.    Pertinent History Reviewed:  Medical & Surgical Hx:   Past Medical History:  Diagnosis Date  . Abnormal uterine bleeding (AUB) 01/26/2015  . Arthritis    psoriatic  . Fatigue 01/26/2015  . History of PCOS 01/26/2015  . LLQ pain 01/26/2015  . Obesity   . PCOS (polycystic ovarian syndrome)   . Psoriasis    Past Surgical History:  Procedure Laterality Date  . CARPAL TUNNEL RELEASE Right 2000  . CESAREAN SECTION  [id number]  . endometrial biopsy    . LAPAROSCOPY FOR ECTOPIC PREGNANCY  1997  . lumph node removed  2007  . TONSILLECTOMY     Family History  Problem Relation Age of Onset  . Hypothyroidism Mother   . Diabetes Father   . Heart disease Father   . Other Father        "something wrong with bones"  . Other Maternal Grandfather        brain tumor  . Cancer Paternal Grandmother        liver  . Epilepsy Son   . Narcolepsy Son     Current Outpatient Medications:  .  aspirin EC 81 MG tablet, Take 81 mg by mouth daily., Disp: , Rfl:  .  folic acid (FOLVITE) 400 MCG tablet, Take 400 mcg by mouth daily., Disp: , Rfl:  .  Multiple Vitamins-Minerals (CENTRUM ADULTS PO), Take daily  by mouth., Disp: , Rfl:  Social History: Reviewed -  reports that  has never smoked. she has never used smokeless tobacco.   Physical Assessment:   Vitals:   12/09/17 0936  BP: 138/70  Pulse: (!) 103  Weight: 236 lb (107 kg)  Body mass index is 43.87 kg/m.           Physical Examination:   General appearance: alert, well appearing, and in no distress  Mental status: alert, oriented to person, place, and time  Skin: warm & dry   Extremities: Edema: Trace    Cardiovascular: normal heart rate noted  Respiratory: normal respiratory effort, no distress  Abdomen: gravid, soft, non-tender  Pelvic: Cervical exam deferred         Fetal Status:     Movement: Present     Was started on glyburide after taking BS for a few weeks after baby was discovered to be large and EFW high.  Made a good effort at watcing carbs and BS were normal.  FBS: <95 except for 1 97 and 2 hr pp all <120    Fetal Surveillance Testing today: NST.  NST: FHR baseline 140 bpm, Variability: absent, Accelerations:present, Decelerations:  Absent= Cat 1/Reactive   Results for orders placed or performed in visit on 12/09/17 (from the past 24 hour(s))  POCT Urinalysis Dipstick  Collection Time: 12/09/17  9:39 AM  Result Value Ref Range   Color, UA     Clarity, UA     Glucose, UA neg    Bilirubin, UA     Ketones, UA neg    Spec Grav, UA  1.010 - 1.025   Blood, UA neg    pH, UA  5.0 - 8.0   Protein, UA neg    Urobilinogen, UA  0.2 or 1.0 E.U./dL   Nitrite, UA neg    Leukocytes, UA Negative Negative   Appearance     Odor      Assessment & Plan:  1) High-risk pregnancy [id number] at [redacted]w[redacted]d with an Estimated Date of Delivery: 01/16/18   2) A1dm, stable  3) AMA, stable  Labs/procedures today: nst  Medications: ASA 81MG   Treatment Plan:  Discussed with [facility].  Will continue twice weekly testing, will alternate BPP/dopplers w/NST d/t AMA+A1DM.   Follow-up: No Follow-up on file.  Orders Placed This Encounter   Procedures  . US FETAL BPP WO NON STRESS  . US OB Follow Up  . [place] UA Cord Doppler  . POCT Urinalysis Dipstick   CRESENZO-DISHMAN, CNM 12/09/2017 10:20 AM

## 2017-12-12 ENCOUNTER — Ambulatory Visit (INDEPENDENT_AMBULATORY_CARE_PROVIDER_SITE_OTHER): Payer: BLUE CROSS/BLUE SHIELD | Admitting: Women's Health

## 2017-12-12 ENCOUNTER — Encounter: Payer: Self-pay | Admitting: Women's Health

## 2017-12-12 ENCOUNTER — Ambulatory Visit (INDEPENDENT_AMBULATORY_CARE_PROVIDER_SITE_OTHER): Payer: BLUE CROSS/BLUE SHIELD

## 2017-12-12 VITALS — BP 120/70 | HR 91 | Wt 237.3 lb

## 2017-12-12 DIAGNOSIS — O24410 Gestational diabetes mellitus in pregnancy, diet controlled: Secondary | ICD-10-CM | POA: Diagnosis not present

## 2017-12-12 DIAGNOSIS — O09523 Supervision of elderly multigravida, third trimester: Secondary | ICD-10-CM

## 2017-12-12 DIAGNOSIS — O0990 Supervision of high risk pregnancy, unspecified, unspecified trimester: Secondary | ICD-10-CM

## 2017-12-12 DIAGNOSIS — Z3A35 35 weeks gestation of pregnancy: Secondary | ICD-10-CM

## 2017-12-12 DIAGNOSIS — Z331 Pregnant state, incidental: Secondary | ICD-10-CM

## 2017-12-12 DIAGNOSIS — O34219 Maternal care for unspecified type scar from previous cesarean delivery: Secondary | ICD-10-CM

## 2017-12-12 DIAGNOSIS — O0993 Supervision of high risk pregnancy, unspecified, third trimester: Secondary | ICD-10-CM

## 2017-12-12 DIAGNOSIS — Z1389 Encounter for screening for other disorder: Secondary | ICD-10-CM

## 2017-12-12 LAB — POCT URINALYSIS DIPSTICK
Blood, UA: NEGATIVE
Glucose, UA: NEGATIVE
Ketones, UA: NEGATIVE
LEUKOCYTES UA: NEGATIVE
NITRITE UA: NEGATIVE
PROTEIN UA: NEGATIVE

## 2017-12-12 NOTE — Patient Instructions (Signed)
[name], I greatly value your feedback.  If you receive a survey following your visit with us today, we appreciate you taking the time to fill it out.  Thanks, Joellyn HaffKim , CNM, WHNP-BC   Call the office [phone]) or go to [facility] if:  You begin to have strong, frequent contractions  Your water breaks.  Sometimes it is a big gush of fluid, sometimes it is just a trickle that keeps getting your panties wet or running down your legs  You have vaginal bleeding.  It is normal to have a small amount of spotting if your cervix was checked.   You don't feel your baby moving like normal.  If you don't, get you something to eat and drink and lay down and focus on feeling your baby move.  You should feel at least 10 movements in 2 hours.  If you don't, you should call the office or go to [facility].     Preterm Labor and Birth Information The normal length of a pregnancy is 39-41 weeks. Preterm labor is when labor starts before 37 completed weeks of pregnancy. What are the risk factors for preterm labor? Preterm labor is more likely to occur in women who:  Have certain infections during pregnancy such as a bladder infection, sexually transmitted infection, or infection inside the uterus (chorioamnionitis).  Have a shorter-than-normal cervix.  Have gone into preterm labor before.  Have had surgery on their cervix.  Are younger than age 43 or older than age 43.  Are African American.  Are pregnant with twins or multiple babies (multiple gestation).  Take street drugs or smoke while pregnant.  Do not gain enough weight while pregnant.  Became pregnant shortly after having been pregnant.  What are the symptoms of preterm labor? Symptoms of preterm labor include:  Cramps similar to those that can happen during a menstrual period. The cramps may happen with diarrhea.  Pain in the abdomen or lower back.  Regular uterine contractions that may feel like  tightening of the abdomen.  A feeling of increased pressure in the pelvis.  Increased watery or bloody mucus discharge from the vagina.  Water breaking (ruptured amniotic sac).  Why is it important to recognize signs of preterm labor? It is important to recognize signs of preterm labor because babies who are born prematurely may not be fully developed. This can put them at an increased risk for:  Long-term (chronic) heart and lung problems.  Difficulty immediately after birth with regulating body systems, including blood sugar, body temperature, heart rate, and breathing rate.  Bleeding in the brain.  Cerebral palsy.  Learning difficulties.  Death.  These risks are highest for babies who are born before 34 weeks of pregnancy. How is preterm labor treated? Treatment depends on the length of your pregnancy, your condition, and the health of your baby. It may involve:  Having a stitch (suture) placed in your cervix to prevent your cervix from opening too early (cerclage).  Taking or being given medicines, such as: ? Hormone medicines. These may be given early in pregnancy to help support the pregnancy. ? Medicine to stop contractions. ? Medicines to help mature the baby's lungs. These may be prescribed if the risk of delivery is high. ? Medicines to prevent your baby from developing cerebral palsy.  If the labor happens before 34 weeks of pregnancy, you may need to stay in the hospital. What should I do if I think I am in preterm labor? If  you think that you are going into preterm labor, call your health care provider right away. How can I prevent preterm labor in future pregnancies? To increase your chance of having a full-term pregnancy:  Do not use any tobacco products, such as cigarettes, chewing tobacco, and e-cigarettes. If you need help quitting, ask your health care provider.  Do not use street drugs or medicines that have not been prescribed to you during your  pregnancy.  Talk with your health care provider before taking any herbal supplements, even if you have been taking them regularly.  Make sure you gain a healthy amount of weight during your pregnancy.  Watch for infection. If you think that you might have an infection, get it checked right away.  Make sure to tell your health care provider if you have gone into preterm labor before.  This information is not intended to replace advice given to you by your health care provider. Make sure you discuss any questions you have with your health care provider. Document Released: 01/18/2004 Document Revised: 04/09/2016 Document Reviewed: 03/20/2016 Elsevier Interactive Patient Education  2018 [employer].

## 2017-12-12 NOTE — Progress Notes (Signed)
   HIGH-RISK PREGNANCY VISIT Patient name: Emily Love Quashie MRN [id number]  Date of birth: 11-May-1976 Chief Complaint:   High Risk Gestation (ultrasound)  History of Present Illness:   Emily Love Englander is a 42 y.o. [id number] female at 733w0d with an Estimated Date of Delivery: 01/16/18 being seen today for ongoing management of a high-risk pregnancy complicated by AMA @ 41yo, A1DM.  Today she reports all fbs <95, 2hr pp 97-150 (only 3 >120, all were at lunch when she ate out). Contractions: Irregular.  .  Movement: Present. denies leaking of fluid.  Review of Systems:   Pertinent items are noted in HPI Denies abnormal vaginal discharge w/ itching/odor/irritation, headaches, visual changes, shortness of breath, chest pain, abdominal pain, severe nausea/vomiting, or problems with urination or bowel movements unless otherwise stated above. Pertinent History Reviewed:  Reviewed past medical,surgical, social, obstetrical and family history.  Reviewed problem list, medications and allergies. Physical Assessment:   Vitals:   12/12/17 1124  BP: 120/70  Pulse: 91  Weight: 237 lb 4.8 oz (107.6 kg)  Body mass index is 44.11 kg/m.           Physical Examination:   General appearance: alert, well appearing, and in no distress  Mental status: alert, oriented to person, place, and time  Skin: warm & dry   Extremities: Edema: Mild pitting, slight indentation    Cardiovascular: normal heart rate noted  Respiratory: normal respiratory effort, no distress  Abdomen: gravid, soft, non-tender  Pelvic: Cervical exam deferred         Fetal Status: Fetal Heart Rate (bpm): 138 u/s   Movement: Present    Fetal Surveillance Testing today: u/s  US 35 wks,cephalic,BPP 8/8,AFI 24 cm,FHR 138 bpm,posterior pl gr 3,RI .57,.66,.60=63%  Results for orders placed or performed in visit on 12/12/17 (from the past 24 hour(s))  POCT urinalysis dipstick   Collection Time: 12/12/17 11:24 AM  Result Value Ref Range   Color, UA     Clarity, UA     Glucose, UA neg    Bilirubin, UA     Ketones, UA neg    Spec Grav, UA  1.010 - 1.025   Blood, UA neg    pH, UA  5.0 - 8.0   Protein, UA neg    Urobilinogen, UA  0.2 or 1.0 E.U./dL   Nitrite, UA neg    Leukocytes, UA Negative Negative   Appearance     Odor      Assessment & Plan:  1) High-risk pregnancy [id number] at 7733w0d with an Estimated Date of Delivery: 01/16/18   2) AMA, stable, continue baby asa  3) A1DM, stable, watch lunch sugars, decrease eating out  Meds: No orders of the defined types were placed in this encounter.   Labs/procedures today: u/s  Treatment Plan:  U/S @ 36wks    2x/wk testing nst/sono     Deliver @ 39wks (RCS)  Reviewed: Preterm labor symptoms and general obstetric precautions including but not limited to vaginal bleeding, contractions, leaking of fluid and fetal movement were reviewed in detail with the patient.  All questions were answered.  Follow-up: Return for schedule tues hrob/nst & fri hrob, bpp/dopp (last appt 2/26).  Orders Placed This Encounter  Procedures  . POCT urinalysis dipstick   Marge DuncansBooker,  [place] CNM, [facility] 12/12/2017 11:48 AM

## 2017-12-12 NOTE — Progress Notes (Signed)
US 35 wks,cephalic,BPP 8/8,AFI 24 cm,FHR 138 bpm,posterior pl gr 3,RI .57,.66,.60=63%

## 2017-12-16 ENCOUNTER — Encounter: Payer: Self-pay | Admitting: Obstetrics & Gynecology

## 2017-12-16 ENCOUNTER — Ambulatory Visit (INDEPENDENT_AMBULATORY_CARE_PROVIDER_SITE_OTHER): Payer: BLUE CROSS/BLUE SHIELD | Admitting: Obstetrics & Gynecology

## 2017-12-16 VITALS — BP 138/76 | HR 95 | Wt 239.0 lb

## 2017-12-16 DIAGNOSIS — O09523 Supervision of elderly multigravida, third trimester: Secondary | ICD-10-CM | POA: Diagnosis not present

## 2017-12-16 DIAGNOSIS — O24410 Gestational diabetes mellitus in pregnancy, diet controlled: Secondary | ICD-10-CM

## 2017-12-16 DIAGNOSIS — O0990 Supervision of high risk pregnancy, unspecified, unspecified trimester: Secondary | ICD-10-CM

## 2017-12-16 DIAGNOSIS — Z3A35 35 weeks gestation of pregnancy: Secondary | ICD-10-CM | POA: Diagnosis not present

## 2017-12-16 NOTE — Progress Notes (Signed)
   HIGH-RISK PREGNANCY VISIT Patient name: Emily Love MRN [id number]  Date of birth: [birth year] Chief Complaint:   High Risk Gestation (nst)  History of Present Illness:   Emily Love is a 42 y.o. [id number] female at 2977w4d with an Estimated Date of Delivery: 01/16/18 being seen today for ongoing management of a high-risk pregnancy complicated by gestational DM, AMA.  Today she reports no complaints. Contractions: Irregular. Vag. Bleeding: None.  Movement: Present. denies leaking of fluid.  Review of Systems:   Pertinent items are noted in HPI Denies abnormal vaginal discharge w/ itching/odor/irritation, headaches, visual changes, shortness of breath, chest pain, abdominal pain, severe nausea/vomiting, or problems with urination or bowel movements unless otherwise stated above. Pertinent History Reviewed:  Reviewed past medical,surgical, social, obstetrical and family history.  Reviewed problem list, medications and allergies. Physical Assessment:   Vitals:   12/16/17 1140  BP: 138/76  Pulse: 95  Weight: 239 lb (108.4 kg)  Body mass index is 44.43 kg/m.           Physical Examination:   General appearance: alert, well appearing, and in no distress  Mental status: alert, oriented to person, place, and time  Skin: warm & dry   Extremities: Edema: Deep pitting, indentation remains for a short time    Cardiovascular: normal heart rate noted  Respiratory: normal respiratory effort, no distress  Abdomen: gravid, soft, non-tender  Pelvic: Cervical exam deferred         Fetal Status:     Movement: Present    Fetal Surveillance Testing today: Reactive NST   No results found for this or any previous visit (from the past 24 hour(s)).  Assessment & Plan:  1) High-risk pregnancy [id number] at 7077w4d with an Estimated Date of Delivery: 01/16/18   2) AMA, stable  3) A1DM, stable normal CBG  Meds: No orders of the defined types were placed in this encounter.   Labs/procedures  today: reactive NST  Treatment Plan:  Tice weekly assessment   Reviewed: Term labor symptoms and general obstetric precautions including but not limited to vaginal bleeding, contractions, leaking of fluid and fetal movement were reviewed in detail with the patient.  All questions were answered.  Follow-up: Return in about 3 days (around 12/19/2017) for NST, HROB.  No orders of the defined types were placed in this encounter.  Lazaro ArmsLuther H Eure 12/16/2017 12:23 PM

## 2017-12-19 ENCOUNTER — Encounter: Payer: Self-pay | Admitting: Obstetrics & Gynecology

## 2017-12-19 ENCOUNTER — Ambulatory Visit (INDEPENDENT_AMBULATORY_CARE_PROVIDER_SITE_OTHER): Payer: BLUE CROSS/BLUE SHIELD | Admitting: Obstetrics & Gynecology

## 2017-12-19 ENCOUNTER — Ambulatory Visit (INDEPENDENT_AMBULATORY_CARE_PROVIDER_SITE_OTHER): Payer: BLUE CROSS/BLUE SHIELD

## 2017-12-19 VITALS — BP 120/70 | HR 88 | Wt 241.0 lb

## 2017-12-19 DIAGNOSIS — O24410 Gestational diabetes mellitus in pregnancy, diet controlled: Secondary | ICD-10-CM | POA: Diagnosis not present

## 2017-12-19 DIAGNOSIS — O0993 Supervision of high risk pregnancy, unspecified, third trimester: Secondary | ICD-10-CM

## 2017-12-19 DIAGNOSIS — Z3A36 36 weeks gestation of pregnancy: Secondary | ICD-10-CM | POA: Diagnosis not present

## 2017-12-19 DIAGNOSIS — O09523 Supervision of elderly multigravida, third trimester: Secondary | ICD-10-CM | POA: Diagnosis not present

## 2017-12-19 DIAGNOSIS — Z1389 Encounter for screening for other disorder: Secondary | ICD-10-CM

## 2017-12-19 DIAGNOSIS — O24419 Gestational diabetes mellitus in pregnancy, unspecified control: Secondary | ICD-10-CM

## 2017-12-19 DIAGNOSIS — Z331 Pregnant state, incidental: Secondary | ICD-10-CM

## 2017-12-19 DIAGNOSIS — O0990 Supervision of high risk pregnancy, unspecified, unspecified trimester: Secondary | ICD-10-CM

## 2017-12-19 LAB — POCT URINALYSIS DIPSTICK
GLUCOSE UA: NEGATIVE
KETONES UA: NEGATIVE
Leukocytes, UA: NEGATIVE
Nitrite, UA: NEGATIVE
Protein, UA: NEGATIVE
RBC UA: NEGATIVE

## 2017-12-19 MED ORDER — PREDNISONE 10 MG PO TABS: ORAL_TABLET | ORAL | 0 refills | Status: DC

## 2017-12-19 MED ORDER — ALBUTEROL SULFATE HFA 108 (90 BASE) MCG/ACT IN AERS: 2.0000 | INHALATION_SPRAY | Freq: Four times a day (QID) | RESPIRATORY_TRACT | 2 refills | Status: DC | PRN

## 2017-12-19 MED ORDER — AZITHROMYCIN 500 MG PO TABS
ORAL_TABLET | ORAL | 0 refills | Status: DC
Start: 2017-12-19 — End: 2017-12-30

## 2017-12-19 NOTE — Progress Notes (Signed)
US 36 wks,cephalic,BPP 8/8,AFI 24 cm,bilat adnexa's wnl,post pl gr 3,fhr 116 bpm,EFW 3454 g 88%,RI .61,.56=61%

## 2017-12-19 NOTE — Progress Notes (Signed)
HIGH-RISK PREGNANCY VISIT Patient name: Emily Love MRN [id number]  Date of birth: [birth year] Chief Complaint:   High Risk Gestation (ultrasound)  History of Present Illness:   Emily Love is a 42 y.o. [id number] female at [redacted]w[redacted]d with an Estimated Date of Delivery: 01/16/18 being seen today for ongoing management of a high-risk pregnancy complicated by GDM + AMA.  Today she reports no complaints. Contractions: Irregular.  .  Movement: Present. denies leaking of fluid.  Review of Systems:   Pertinent items are noted in HPI Denies abnormal vaginal discharge w/ itching/odor/irritation, headaches, visual changes, shortness of breath, chest pain, abdominal pain, severe nausea/vomiting, or problems with urination or bowel movements unless otherwise stated above. Pertinent History Reviewed:  Reviewed past medical,surgical, social, obstetrical and family history.  Reviewed problem list, medications and allergies. Physical Assessment:   Vitals:   12/19/17 1222  BP: 120/70  Pulse: 88  Weight: 241 lb (109.3 kg)  Body mass index is 44.8 kg/m.           Physical Examination:   General appearance: alert, well appearing, and in no distress  Mental status: alert, oriented to person, place, and time  Skin: warm & dry   Extremities: Edema: Mild pitting, slight indentation    Cardiovascular: normal heart rate noted  Respiratory: normal respiratory effort, no distress  Abdomen: gravid, soft, non-tender  Pelvic: Cervical exam deferred         Fetal Status:     Movement: Present    Fetal Surveillance Testing today: BPP 8/8   Results for orders placed or performed in visit on 12/19/17 (from the past 24 hour(s))  POCT urinalysis dipstick   Collection Time: 12/19/17 12:26 PM  Result Value Ref Range   Color, UA     Clarity, UA     Glucose, UA neg    Bilirubin, UA     Ketones, UA neg    Spec Grav, UA  1.010 - 1.025   Blood, UA neg    pH, UA  5.0 - 8.0   Protein, UA neg    Urobilinogen, UA  0.2 or 1.0 E.U./dL   Nitrite, UA neg    Leukocytes, UA Negative Negative   Appearance     Odor      Assessment & Plan:  1) High-risk pregnancy [id number] at [redacted]w[redacted]d with an Estimated Date of Delivery: 01/16/18   2) GDM, stable, good CBG control  3) AMA, stable  Meds:  Meds ordered this encounter  Medications  . predniSONE (DELTASONE) 10 MG tablet    Sig: Take 4 tablets at once daily    Dispense:  40 tablet    Refill:  0  . albuterol (PROVENTIL HFA;VENTOLIN HFA) 108 (90 Base) MCG/ACT inhaler    Sig: Inhale 2 puffs into the lungs every 6 (six) hours as needed for wheezing or shortness of breath.    Dispense:  1 Inhaler    Refill:  2  . azithromycin (ZITHROMAX) 500 MG tablet    Sig: Take 2 tablets today, then 1 daily til finished    Dispense:  8 tablet    Refill:  0    Labs/procedures today: BPP 8/8  Treatment Plan:  Twice weekly surveillance due to [facility]  Reviewed: Term labor symptoms and general obstetric precautions including but not limited to vaginal bleeding, contractions, leaking of fluid and fetal movement were reviewed in detail with the patient.  All questions were answered.  Follow-up: Return in about 4 days (around 12/23/2017) for  NST, HROB.  Orders Placed This Encounter  Procedures  . POCT urinalysis dipstick   Lazaro ArmsLuther H   12/19/2017 1:12 PM

## 2017-12-23 ENCOUNTER — Encounter: Payer: Self-pay | Admitting: Obstetrics & Gynecology

## 2017-12-23 ENCOUNTER — Ambulatory Visit (INDEPENDENT_AMBULATORY_CARE_PROVIDER_SITE_OTHER): Payer: BLUE CROSS/BLUE SHIELD | Admitting: Obstetrics & Gynecology

## 2017-12-23 VITALS — BP 138/60 | HR 99 | Wt 239.5 lb

## 2017-12-23 DIAGNOSIS — O24410 Gestational diabetes mellitus in pregnancy, diet controlled: Secondary | ICD-10-CM | POA: Diagnosis not present

## 2017-12-23 DIAGNOSIS — O09523 Supervision of elderly multigravida, third trimester: Secondary | ICD-10-CM | POA: Diagnosis not present

## 2017-12-23 DIAGNOSIS — Z3A36 36 weeks gestation of pregnancy: Secondary | ICD-10-CM

## 2017-12-23 DIAGNOSIS — Z331 Pregnant state, incidental: Secondary | ICD-10-CM

## 2017-12-23 DIAGNOSIS — Z3483 Encounter for supervision of other normal pregnancy, third trimester: Secondary | ICD-10-CM | POA: Diagnosis not present

## 2017-12-23 DIAGNOSIS — Z1389 Encounter for screening for other disorder: Secondary | ICD-10-CM

## 2017-12-23 DIAGNOSIS — O0993 Supervision of high risk pregnancy, unspecified, third trimester: Secondary | ICD-10-CM

## 2017-12-23 LAB — POCT URINALYSIS DIPSTICK
Glucose, UA: NEGATIVE
Ketones, UA: NEGATIVE
Leukocytes, UA: NEGATIVE
Nitrite, UA: NEGATIVE
Protein, UA: NEGATIVE

## 2017-12-23 NOTE — Progress Notes (Signed)
   HIGH-RISK PREGNANCY VISIT Patient name: Emily Love MRN [id number]  Date of birth: January 09, 1976 Chief Complaint:   High Risk Gestation (NST; GBS, GC/CHL; + pressure; having stronger contractions)  History of Present Illness:   Emily Love is a 42 y.o. [id number] female at 7010w4d with an Estimated Date of Delivery: 01/16/18 being seen today for ongoing management of a high-risk pregnancy complicated by gestational DM, AMA  Today she reports occasional contractions. Contractions: Irregular. Vag. Bleeding: None.  Movement: Present. denies leaking of fluid.  Review of Systems:   Pertinent items are noted in HPI Denies abnormal vaginal discharge w/ itching/odor/irritation, headaches, visual changes, shortness of breath, chest pain, abdominal pain, severe nausea/vomiting, or problems with urination or bowel movements unless otherwise stated above. Pertinent History Reviewed:  Reviewed past medical,surgical, social, obstetrical and family history.  Reviewed problem list, medications and allergies. Physical Assessment:   Vitals:   12/23/17 1140  BP: 138/60  Pulse: 99  Weight: 239 lb 8 oz (108.6 kg)  Body mass index is 44.52 kg/m.           Physical Examination:   General appearance: alert, well appearing, and in no distress  Mental status: alert, oriented to person, place, and time  Skin: warm & dry   Extremities: Edema: Mild pitting, slight indentation    Cardiovascular: normal heart rate noted  Respiratory: normal respiratory effort, no distress  Abdomen: gravid, soft, non-tender  Pelvic: Cervical exam deferred         Fetal Status:     Movement: Present    Fetal Surveillance Testing today: reactive NST   Results for orders placed or performed in visit on 12/23/17 (from the past 24 hour(s))  POCT urinalysis dipstick   Collection Time: 12/23/17 11:42 AM  Result Value Ref Range   Color, UA     Clarity, UA     Glucose, UA neg    Bilirubin, UA     Ketones, UA neg    Spec  Grav, UA  1.010 - 1.025   Blood, UA trace    pH, UA  5.0 - 8.0   Protein, UA neg    Urobilinogen, UA  0.2 or 1.0 E.U./dL   Nitrite, UA neg    Leukocytes, UA Negative Negative   Appearance     Odor      Assessment & Plan:  1) High-risk pregnancy [id number] at 310w4d with an Estimated Date of Delivery: 01/16/18   2) Class A1 DM, stable, CBG excellent  3) AMA, stable  Meds: No orders of the defined types were placed in this encounter.   Labs/procedures today:   Treatment Plan:  Twice weekly surveillance due to 42 y/o Repeat section has been scheduled  Reviewed: Term labor symptoms and general obstetric precautions including but not limited to vaginal bleeding, contractions, leaking of fluid and fetal movement were reviewed in detail with the patient.  All questions were answered.  Follow-up: Return in about 3 days (around 12/26/2017) for NST, HROB.  Orders Placed This Encounter  Procedures  . GC/Chlamydia Probe Amp  . Strep Gp B NAA+Rflx  . POCT urinalysis dipstick   Lazaro ArmsLuther H Eure 12/23/2017 12:25 PM

## 2017-12-25 ENCOUNTER — Telehealth (HOSPITAL_COMMUNITY): Payer: Self-pay | Admitting: *Deleted

## 2017-12-25 LAB — GC/CHLAMYDIA PROBE AMP
Chlamydia trachomatis, NAA: NEGATIVE
NEISSERIA GONORRHOEAE BY PCR: NEGATIVE

## 2017-12-25 LAB — STREP GP B NAA+RFLX: STREP GP B NAA+RFLX: NEGATIVE

## 2017-12-25 NOTE — Telephone Encounter (Signed)
Preadmission screen  

## 2017-12-26 ENCOUNTER — Encounter: Payer: Self-pay | Admitting: Women's Health

## 2017-12-26 ENCOUNTER — Ambulatory Visit (INDEPENDENT_AMBULATORY_CARE_PROVIDER_SITE_OTHER): Payer: BLUE CROSS/BLUE SHIELD

## 2017-12-26 ENCOUNTER — Telehealth (HOSPITAL_COMMUNITY): Payer: Self-pay | Admitting: *Deleted

## 2017-12-26 ENCOUNTER — Ambulatory Visit (INDEPENDENT_AMBULATORY_CARE_PROVIDER_SITE_OTHER): Payer: BLUE CROSS/BLUE SHIELD | Admitting: Women's Health

## 2017-12-26 VITALS — BP 130/80 | HR 96 | Wt 238.8 lb

## 2017-12-26 DIAGNOSIS — O09523 Supervision of elderly multigravida, third trimester: Secondary | ICD-10-CM

## 2017-12-26 DIAGNOSIS — Z3A37 37 weeks gestation of pregnancy: Secondary | ICD-10-CM

## 2017-12-26 DIAGNOSIS — O0990 Supervision of high risk pregnancy, unspecified, unspecified trimester: Secondary | ICD-10-CM

## 2017-12-26 DIAGNOSIS — Z1389 Encounter for screening for other disorder: Secondary | ICD-10-CM

## 2017-12-26 DIAGNOSIS — O0993 Supervision of high risk pregnancy, unspecified, third trimester: Secondary | ICD-10-CM

## 2017-12-26 DIAGNOSIS — O24410 Gestational diabetes mellitus in pregnancy, diet controlled: Secondary | ICD-10-CM

## 2017-12-26 DIAGNOSIS — Z331 Pregnant state, incidental: Secondary | ICD-10-CM

## 2017-12-26 LAB — POCT URINALYSIS DIPSTICK
GLUCOSE UA: NEGATIVE
KETONES UA: NEGATIVE
Leukocytes, UA: NEGATIVE
Nitrite, UA: NEGATIVE
RBC UA: NEGATIVE

## 2017-12-26 MED ORDER — NYSTATIN 100000 UNIT/ML MT SUSP: 5.0000 mL | Freq: Four times a day (QID) | OROMUCOSAL | 0 refills | Status: DC

## 2017-12-26 NOTE — Patient Instructions (Signed)
Emily Love, I greatly value your feedback.  If you receive a survey following your visit with us today, we appreciate you taking the time to fill it out.  Thanks, Kim , CNM, WHNP-BC   Call the office [phone]) or go to [facility] if:  You begin to have strong, frequent contractions  Your water breaks.  Sometimes it is a big gush of fluid, sometimes it is just a trickle that keeps getting your panties wet or running down your legs  You have vaginal bleeding.  It is normal to have a small amount of spotting if your cervix was checked.   You don't feel your baby moving like normal.  If you don't, get you something to eat and drink and lay down and focus on feeling your baby move.  You should feel at least 10 movements in 2 hours.  If you don't, you should call the office or go to [facility].     Braxton Hicks Contractions Contractions of the uterus can occur throughout pregnancy, but they are not always a sign that you are in labor. You may have practice contractions called Braxton Hicks contractions. These false labor contractions are sometimes confused with true labor. What are Braxton Hicks contractions? Braxton Hicks contractions are tightening movements that occur in the muscles of the uterus before labor. Unlike true labor contractions, these contractions do not result in opening (dilation) and thinning of the cervix. Toward the end of pregnancy (32-34 weeks), Braxton Hicks contractions can happen more often and may become stronger. These contractions are sometimes difficult to tell apart from true labor because they can be very uncomfortable. You should not feel embarrassed if you go to the hospital with false labor. Sometimes, the only way to tell if you are in true labor is for your health care provider to look for changes in the cervix. The health care provider will do a physical exam and may monitor your contractions. If you are not in true labor, the exam should  show that your cervix is not dilating and your water has not broken. If there are other health problems associated with your pregnancy, it is completely safe for you to be sent home with false labor. You may continue to have Braxton Hicks contractions until you go into true labor. How to tell the difference between true labor and false labor True labor  Contractions last 30-70 seconds.  Contractions become very regular.  Discomfort is usually felt in the top of the uterus, and it spreads to the lower abdomen and low back.  Contractions do not go away with walking.  Contractions usually become more intense and increase in frequency.  The cervix dilates and gets thinner. False labor  Contractions are usually shorter and not as strong as true labor contractions.  Contractions are usually irregular.  Contractions are often felt in the front of the lower abdomen and in the groin.  Contractions may go away when you walk around or change positions while lying down.  Contractions get weaker and are shorter-lasting as time goes on.  The cervix usually does not dilate or become thin. Follow these instructions at home:  Take over-the-counter and prescription medicines only as told by your health care provider.  Keep up with your usual exercises and follow other instructions from your health care provider.  Eat and drink lightly if you think you are going into labor.  If Braxton Hicks contractions are making you uncomfortable: ? Change your position from lying   down or resting to walking, or change from walking to resting. ? Sit and rest in a tub of warm water. ? Drink enough fluid to keep your urine pale yellow. Dehydration may cause these contractions. ? Do slow and deep breathing several times an hour.  Keep all follow-up prenatal visits as told by your health care provider. This is important. Contact a health care provider if:  You have a fever.  You have continuous pain in  your abdomen. Get help right away if:  Your contractions become stronger, more regular, and closer together.  You have fluid leaking or gushing from your vagina.  You pass blood-tinged mucus (bloody show).  You have bleeding from your vagina.  You have low back pain that you never had before.  You feel your baby's head pushing down and causing pelvic pressure.  Your baby is not moving inside you as much as it used to. Summary  Contractions that occur before labor are called Braxton Hicks contractions, false labor, or practice contractions.  Braxton Hicks contractions are usually shorter, weaker, farther apart, and less regular than true labor contractions. True labor contractions usually become progressively stronger and regular and they become more frequent.  Manage discomfort from [facility] contractions by changing position, resting in a warm bath, drinking plenty of water, or practicing deep breathing. This information is not intended to replace advice given to you by your health care provider. Make sure you discuss any questions you have with your health care provider. Document Released: 03/13/2017 Document Revised: 03/13/2017 Document Reviewed: 03/13/2017 Elsevier Interactive Patient Education  2018 [employer].

## 2017-12-26 NOTE — Progress Notes (Signed)
US 37 wks,cephalic,BPP 8/8,FHR 132 bpm,posterior pl gr 3,afi 18 cm,RI .66,.65=70%,bilat adnexa's wnl

## 2017-12-26 NOTE — Progress Notes (Signed)
HIGH-RISK PREGNANCY VISIT Patient name: Emily Love MRN [id number]  Date of birth: [birth year] Chief Complaint:   High Risk Gestation (ultrasound)  History of Present Illness:   Emily Caterlicia L Warr is a 42 y.o. [id number] female at 3160w0d with an Estimated Date of Delivery: 01/16/18 being seen today for ongoing management of a high-risk pregnancy complicated by AMA, A1DM.  Today she reports all sugars wnl, has thrush from recent antibiotic. Contractions: Irregular.  .  Movement: Present. denies leaking of fluid.  Review of Systems:   Pertinent items are noted in HPI Denies abnormal vaginal discharge w/ itching/odor/irritation, headaches, visual changes, shortness of breath, chest pain, abdominal pain, severe nausea/vomiting, or problems with urination or bowel movements unless otherwise stated above. Pertinent History Reviewed:  Reviewed past medical,surgical, social, obstetrical and family history.  Reviewed problem list, medications and allergies. Physical Assessment:   Vitals:   12/26/17 1217  BP: 130/80  Pulse: 96  Weight: 238 lb 12.8 oz (108.3 kg)  Body mass index is 44.39 kg/m.           Physical Examination:   General appearance: alert, well appearing, and in no distress  Mental status: alert, oriented to person, place, and time  Skin: warm & dry   Extremities: Edema: Mild pitting, slight indentation    Cardiovascular: normal heart rate noted  Respiratory: normal respiratory effort, no distress  Abdomen: gravid, soft, non-tender  Pelvic: Cervical exam deferred         Fetal Status: Fetal Heart Rate (bpm): 132 u/s   Movement: Present    Fetal Surveillance Testing today:  US 37 wks,cephalic,BPP 8/8,FHR 132 bpm,posterior pl gr 3,afi 18.5 cm,RI .66,.65=70%,bilat adnexa's wnl  Results for orders placed or performed in visit on 12/26/17 (from the past 24 hour(s))  POCT urinalysis dipstick   Collection Time: 12/26/17 12:23 PM  Result Value Ref Range   Color, UA     Clarity, UA     Glucose, UA neg    Bilirubin, UA     Ketones, UA neg    Spec Grav, UA  1.010 - 1.025   Blood, UA neg    pH, UA  5.0 - 8.0   Protein, UA trace    Urobilinogen, UA  0.2 or 1.0 E.U./dL   Nitrite, UA neg    Leukocytes, UA Negative Negative   Appearance     Odor      Assessment & Plan:  1) High-risk pregnancy [id number] at 5360w0d with an Estimated Date of Delivery: 01/16/18   2) AMA  3) A1DM, stable  Meds:  Meds ordered this encounter  Medications  . nystatin (MYCOSTATIN) 100000 UNIT/ML suspension    Sig: Take 5 mLs (500,000 Units total) by mouth 4 (four) times daily. Swish and hold as long as possible, then swallow    Dispense:  60 mL    Refill:  0    Order Specific Question:   Supervising Provider    Answer:   Lazaro ArmsEURE, LUTHER H [[id number]]    Labs/procedures today: u/s  Treatment Plan:  2x/wk testing nst alt w/ sono, RCS 3/1 as scheduled  Reviewed: Term labor symptoms and general obstetric precautions including but not limited to vaginal bleeding, contractions, leaking of fluid and fetal movement were reviewed in detail with the patient.  All questions were answered.  Follow-up: Return for As scheduled Tues for hrob/nst.  Orders Placed This Encounter  Procedures  . POCT urinalysis dipstick   Cheral MarkerKimberly R  CNM, [facility] CenterWHNP-BC 12/26/2017 12:48 PM

## 2017-12-26 NOTE — Telephone Encounter (Signed)
Preadmission screen  

## 2017-12-29 ENCOUNTER — Encounter (HOSPITAL_COMMUNITY): Payer: Self-pay

## 2017-12-30 ENCOUNTER — Other Ambulatory Visit: Payer: Self-pay

## 2017-12-30 ENCOUNTER — Encounter: Payer: Self-pay | Admitting: Women's Health

## 2017-12-30 ENCOUNTER — Other Ambulatory Visit: Payer: Self-pay | Admitting: Obstetrics and Gynecology

## 2017-12-30 ENCOUNTER — Inpatient Hospital Stay (HOSPITAL_COMMUNITY): Payer: BLUE CROSS/BLUE SHIELD | Admitting: Anesthesiology

## 2017-12-30 ENCOUNTER — Ambulatory Visit (INDEPENDENT_AMBULATORY_CARE_PROVIDER_SITE_OTHER): Payer: BLUE CROSS/BLUE SHIELD | Admitting: Women's Health

## 2017-12-30 ENCOUNTER — Encounter (HOSPITAL_COMMUNITY)
Admission: AD | Disposition: A | Discharge status: Home / Self Care | Payer: Self-pay | Source: Ambulatory Visit | Attending: Obstetrics and Gynecology

## 2017-12-30 ENCOUNTER — Inpatient Hospital Stay (HOSPITAL_COMMUNITY)
Admission: AD | Admit: 2017-12-30 | Discharge: 2018-01-01 | DRG: 785 | Disposition: A | Discharge status: Home / Self Care | Payer: BLUE CROSS/BLUE SHIELD | Source: Ambulatory Visit | Attending: Obstetrics and Gynecology | Admitting: Obstetrics and Gynecology

## 2017-12-30 ENCOUNTER — Encounter (HOSPITAL_COMMUNITY): Payer: Self-pay

## 2017-12-30 VITALS — BP 142/78 | HR 98 | Wt 239.5 lb

## 2017-12-30 DIAGNOSIS — O133 Gestational [pregnancy-induced] hypertension without significant proteinuria, third trimester: Secondary | ICD-10-CM

## 2017-12-30 DIAGNOSIS — Z98891 History of uterine scar from previous surgery: Secondary | ICD-10-CM

## 2017-12-30 DIAGNOSIS — O09523 Supervision of elderly multigravida, third trimester: Secondary | ICD-10-CM

## 2017-12-30 DIAGNOSIS — Z3A37 37 weeks gestation of pregnancy: Secondary | ICD-10-CM

## 2017-12-30 DIAGNOSIS — O24410 Gestational diabetes mellitus in pregnancy, diet controlled: Secondary | ICD-10-CM | POA: Diagnosis not present

## 2017-12-30 DIAGNOSIS — O139 Gestational [pregnancy-induced] hypertension without significant proteinuria, unspecified trimester: Secondary | ICD-10-CM | POA: Diagnosis present

## 2017-12-30 DIAGNOSIS — O99214 Obesity complicating childbirth: Secondary | ICD-10-CM | POA: Diagnosis present

## 2017-12-30 DIAGNOSIS — O0993 Supervision of high risk pregnancy, unspecified, third trimester: Secondary | ICD-10-CM

## 2017-12-30 DIAGNOSIS — O24420 Gestational diabetes mellitus in childbirth, diet controlled: Secondary | ICD-10-CM | POA: Diagnosis not present

## 2017-12-30 DIAGNOSIS — O34211 Maternal care for low transverse scar from previous cesarean delivery: Secondary | ICD-10-CM | POA: Diagnosis present

## 2017-12-30 DIAGNOSIS — O403XX0 Polyhydramnios, third trimester, not applicable or unspecified: Secondary | ICD-10-CM | POA: Diagnosis present

## 2017-12-30 DIAGNOSIS — Z880 Allergy status to penicillin: Secondary | ICD-10-CM

## 2017-12-30 DIAGNOSIS — O9989 Other specified diseases and conditions complicating pregnancy, childbirth and the puerperium: Secondary | ICD-10-CM

## 2017-12-30 DIAGNOSIS — O09899 Supervision of other high risk pregnancies, unspecified trimester: Secondary | ICD-10-CM

## 2017-12-30 DIAGNOSIS — Z302 Encounter for sterilization: Secondary | ICD-10-CM

## 2017-12-30 DIAGNOSIS — O34219 Maternal care for unspecified type scar from previous cesarean delivery: Secondary | ICD-10-CM | POA: Diagnosis not present

## 2017-12-30 DIAGNOSIS — O134 Gestational [pregnancy-induced] hypertension without significant proteinuria, complicating childbirth: Principal | ICD-10-CM | POA: Diagnosis present

## 2017-12-30 DIAGNOSIS — Z2839 Other underimmunization status: Secondary | ICD-10-CM

## 2017-12-30 DIAGNOSIS — Z331 Pregnant state, incidental: Secondary | ICD-10-CM

## 2017-12-30 DIAGNOSIS — Z283 Underimmunization status: Secondary | ICD-10-CM

## 2017-12-30 DIAGNOSIS — O09529 Supervision of elderly multigravida, unspecified trimester: Secondary | ICD-10-CM

## 2017-12-30 DIAGNOSIS — Z1389 Encounter for screening for other disorder: Secondary | ICD-10-CM

## 2017-12-30 LAB — COMPREHENSIVE METABOLIC PANEL
ALT: 15 U/L (ref 14–54)
AST: 21 U/L (ref 15–41)
Albumin: 3.1 g/dL — ABNORMAL LOW (ref 3.5–5.0)
Alkaline Phosphatase: 270 U/L — ABNORMAL HIGH (ref 38–126)
Anion gap: 8 (ref 5–15)
BUN: 9 mg/dL (ref 6–20)
CHLORIDE: 109 mmol/L (ref 101–111)
CO2: 18 mmol/L — AB (ref 22–32)
CREATININE: 0.65 mg/dL (ref 0.44–1.00)
Calcium: 9.1 mg/dL (ref 8.9–10.3)
GFR calc non Af Amer: >60 mL/min (ref >60)
Glucose, Bld: 86 mg/dL (ref 65–99)
Potassium: 4 mmol/L (ref 3.5–5.1)
SODIUM: 135 mmol/L (ref 135–145)
Total Bilirubin: 0.6 mg/dL (ref 0.3–1.2)
Total Protein: 6.5 g/dL (ref 6.5–8.1)

## 2017-12-30 LAB — ABO/RH: ABO/RH(D): A POS

## 2017-12-30 LAB — POCT URINALYSIS DIPSTICK
GLUCOSE UA: NEGATIVE
KETONES UA: NEGATIVE
Leukocytes, UA: NEGATIVE
Nitrite, UA: NEGATIVE
Protein, UA: NEGATIVE

## 2017-12-30 LAB — URINALYSIS, ROUTINE W REFLEX MICROSCOPIC
BILIRUBIN URINE: NEGATIVE
GLUCOSE, UA: NEGATIVE mg/dL
KETONES UR: 5 mg/dL — AB
LEUKOCYTES UA: NEGATIVE
NITRITE: NEGATIVE
PH: 7 (ref 5.0–8.0)
Protein, ur: NEGATIVE mg/dL
Specific Gravity, Urine: 1.008 (ref 1.005–1.030)

## 2017-12-30 LAB — CBC
HCT: 41.5 % (ref 36.0–46.0)
Hemoglobin: 14.1 g/dL (ref 12.0–15.0)
MCH: 31.3 pg (ref 26.0–34.0)
MCHC: 34 g/dL (ref 30.0–36.0)
MCV: 92.2 fL (ref 78.0–100.0)
PLATELETS: 251 10*3/uL (ref 150–400)
RBC: 4.5 MIL/uL (ref 3.87–5.11)
RDW: 14.5 % (ref 11.5–15.5)
WBC: 17.7 10*3/uL — ABNORMAL HIGH (ref 4.0–10.5)

## 2017-12-30 LAB — PROTEIN / CREATININE RATIO, URINE
Creatinine, Urine: 37 mg/dL
Protein Creatinine Ratio: 0.16 mg/mg{Cre} — ABNORMAL HIGH (ref 0.00–0.15)
TOTAL PROTEIN, URINE: 6 mg/dL

## 2017-12-30 LAB — TYPE AND SCREEN
ABO/RH(D): A POS
ANTIBODY SCREEN: NEGATIVE

## 2017-12-30 SURGERY — Surgical Case
Anesthesia: Spinal | Site: Abdomen | Laterality: Bilateral | Wound class: Clean Contaminated

## 2017-12-30 MED ORDER — OXYTOCIN 40 UNITS IN LACTATED RINGERS INFUSION - SIMPLE MED: 2.5000 [IU]/h | INTRAVENOUS | Status: AC

## 2017-12-30 MED ORDER — LACTATED RINGERS IV SOLN
INTRAVENOUS | Status: DC | PRN
  Administered 2017-12-30: 40 [IU] via INTRAVENOUS

## 2017-12-30 MED ORDER — FENTANYL CITRATE (PF) 100 MCG/2ML IJ SOLN
INTRAMUSCULAR | Status: AC
  Filled 2017-12-30: qty 2

## 2017-12-30 MED ORDER — SCOPOLAMINE 1 MG/3DAYS TD PT72
MEDICATED_PATCH | TRANSDERMAL | Status: DC | PRN
  Administered 2017-12-30: 1 via TRANSDERMAL

## 2017-12-30 MED ORDER — DIPHENHYDRAMINE HCL 50 MG/ML IJ SOLN: 12.5000 mg | INTRAMUSCULAR | Status: DC | PRN

## 2017-12-30 MED ORDER — SIMETHICONE 80 MG PO CHEW
80.0000 mg | CHEWABLE_TABLET | Freq: Three times a day (TID) | ORAL | Status: DC
  Administered 2017-12-31 (×3): 80 mg via ORAL
  Filled 2017-12-30 (×4): qty 1

## 2017-12-30 MED ORDER — FENTANYL CITRATE (PF) 100 MCG/2ML IJ SOLN
INTRAMUSCULAR | Status: DC | PRN
  Administered 2017-12-30: 10 ug via INTRATHECAL

## 2017-12-30 MED ORDER — SIMETHICONE 80 MG PO CHEW
80.0000 mg | CHEWABLE_TABLET | ORAL | Status: DC
  Administered 2017-12-30 – 2017-12-31 (×2): 80 mg via ORAL
  Filled 2017-12-30 (×2): qty 1

## 2017-12-30 MED ORDER — MORPHINE SULFATE (PF) 0.5 MG/ML IJ SOLN
INTRAMUSCULAR | Status: AC
  Filled 2017-12-30: qty 10

## 2017-12-30 MED ORDER — ENOXAPARIN SODIUM 60 MG/0.6ML ~~LOC~~ SOLN
50.0000 mg | SUBCUTANEOUS | Status: DC
  Administered 2017-12-31 – 2018-01-01 (×2): 50 mg via SUBCUTANEOUS
  Filled 2017-12-30 (×2): qty 0.6

## 2017-12-30 MED ORDER — PHENYLEPHRINE 8 MG IN D5W 100 ML (0.08MG/ML) PREMIX OPTIME
INJECTION | INTRAVENOUS | Status: AC
  Filled 2017-12-30: qty 100

## 2017-12-30 MED ORDER — NALBUPHINE HCL 10 MG/ML IJ SOLN: 5.0000 mg | INTRAMUSCULAR | Status: DC | PRN

## 2017-12-30 MED ORDER — ONDANSETRON HCL 4 MG/2ML IJ SOLN
INTRAMUSCULAR | Status: AC
  Filled 2017-12-30: qty 2

## 2017-12-30 MED ORDER — NALOXONE HCL 0.4 MG/ML IJ SOLN: 0.4000 mg | INTRAMUSCULAR | Status: DC | PRN

## 2017-12-30 MED ORDER — NALOXONE HCL 4 MG/10ML IJ SOLN: 1.0000 ug/kg/h | INTRAVENOUS | Status: DC | PRN

## 2017-12-30 MED ORDER — MORPHINE SULFATE (PF) 0.5 MG/ML IJ SOLN
INTRAMUSCULAR | Status: DC | PRN
  Administered 2017-12-30: .2 mg via INTRATHECAL

## 2017-12-30 MED ORDER — SCOPOLAMINE 1 MG/3DAYS TD PT72: 1.0000 | MEDICATED_PATCH | Freq: Once | TRANSDERMAL | Status: DC

## 2017-12-30 MED ORDER — DIBUCAINE 1 % RE OINT: 1.0000 "application " | TOPICAL_OINTMENT | RECTAL | Status: DC | PRN

## 2017-12-30 MED ORDER — ONDANSETRON HCL 4 MG/2ML IJ SOLN: 4.0000 mg | Freq: Three times a day (TID) | INTRAMUSCULAR | Status: DC | PRN

## 2017-12-30 MED ORDER — GENTAMICIN SULFATE 40 MG/ML IJ SOLN
INTRAVENOUS | Status: AC
  Administered 2017-12-30: 115 mL via INTRAVENOUS
  Filled 2017-12-30: qty 9.25

## 2017-12-30 MED ORDER — OXYTOCIN 10 UNIT/ML IJ SOLN
INTRAMUSCULAR | Status: AC
  Filled 2017-12-30: qty 4

## 2017-12-30 MED ORDER — MENTHOL 3 MG MT LOZG: 1.0000 | LOZENGE | OROMUCOSAL | Status: DC | PRN

## 2017-12-30 MED ORDER — LACTATED RINGERS IV SOLN
INTRAVENOUS | Status: DC | PRN
  Administered 2017-12-30 (×3): via INTRAVENOUS

## 2017-12-30 MED ORDER — ALBUTEROL SULFATE (2.5 MG/3ML) 0.083% IN NEBU: 3.0000 mL | INHALATION_SOLUTION | Freq: Four times a day (QID) | RESPIRATORY_TRACT | Status: DC | PRN

## 2017-12-30 MED ORDER — DIPHENHYDRAMINE HCL 25 MG PO CAPS: 25.0000 mg | ORAL_CAPSULE | ORAL | Status: DC | PRN

## 2017-12-30 MED ORDER — SOD CITRATE-CITRIC ACID 500-334 MG/5ML PO SOLN
30.0000 mL | Freq: Once | ORAL | Status: AC
  Administered 2017-12-30: 30 mL via ORAL
  Filled 2017-12-30: qty 15

## 2017-12-30 MED ORDER — MEPERIDINE HCL 25 MG/ML IJ SOLN
INTRAMUSCULAR | Status: DC | PRN
  Administered 2017-12-30 (×2): 12.5 mg via INTRAVENOUS

## 2017-12-30 MED ORDER — COCONUT OIL OIL: 1.0000 "application " | TOPICAL_OIL | Status: DC | PRN

## 2017-12-30 MED ORDER — HYDROMORPHONE HCL 1 MG/ML IJ SOLN
INTRAMUSCULAR | Status: AC
  Filled 2017-12-30: qty 1

## 2017-12-30 MED ORDER — ZOLPIDEM TARTRATE 5 MG PO TABS: 5.0000 mg | ORAL_TABLET | Freq: Every evening | ORAL | Status: DC | PRN

## 2017-12-30 MED ORDER — FAMOTIDINE IN NACL 20-0.9 MG/50ML-% IV SOLN
20.0000 mg | Freq: Once | INTRAVENOUS | Status: AC
  Administered 2017-12-30: 20 mg via INTRAVENOUS
  Filled 2017-12-30: qty 50

## 2017-12-30 MED ORDER — HYDROMORPHONE HCL 1 MG/ML IJ SOLN
INTRAMUSCULAR | Status: DC | PRN
  Administered 2017-12-30: 1 mg via INTRAVENOUS

## 2017-12-30 MED ORDER — PHENYLEPHRINE 40 MCG/ML (10ML) SYRINGE FOR IV PUSH (FOR BLOOD PRESSURE SUPPORT)
PREFILLED_SYRINGE | INTRAVENOUS | Status: AC
  Filled 2017-12-30: qty 10

## 2017-12-30 MED ORDER — NALBUPHINE HCL 10 MG/ML IJ SOLN: 5.0000 mg | Freq: Once | INTRAMUSCULAR | Status: DC | PRN

## 2017-12-30 MED ORDER — ONDANSETRON HCL 4 MG/2ML IJ SOLN
INTRAMUSCULAR | Status: DC | PRN
  Administered 2017-12-30: 4 mg via INTRAVENOUS

## 2017-12-30 MED ORDER — LACTATED RINGERS IV SOLN
INTRAVENOUS | Status: DC
  Administered 2017-12-30: via INTRAVENOUS

## 2017-12-30 MED ORDER — SENNOSIDES-DOCUSATE SODIUM 8.6-50 MG PO TABS
2.0000 | ORAL_TABLET | ORAL | Status: DC
  Administered 2017-12-31: 2 via ORAL
  Filled 2017-12-30: qty 2

## 2017-12-30 MED ORDER — PHENYLEPHRINE 8 MG IN D5W 100 ML (0.08MG/ML) PREMIX OPTIME
INJECTION | INTRAVENOUS | Status: DC | PRN
  Administered 2017-12-30: 60 ug/min via INTRAVENOUS

## 2017-12-30 MED ORDER — PHENYLEPHRINE 40 MCG/ML (10ML) SYRINGE FOR IV PUSH (FOR BLOOD PRESSURE SUPPORT)
PREFILLED_SYRINGE | INTRAVENOUS | Status: DC | PRN
  Administered 2017-12-30: 80 ug via INTRAVENOUS
  Administered 2017-12-30 (×2): 120 ug via INTRAVENOUS

## 2017-12-30 MED ORDER — FENTANYL CITRATE (PF) 100 MCG/2ML IJ SOLN
25.0000 ug | INTRAMUSCULAR | Status: DC | PRN
  Administered 2017-12-30: 25 ug via INTRAVENOUS

## 2017-12-30 MED ORDER — IBUPROFEN 600 MG PO TABS
600.0000 mg | ORAL_TABLET | Freq: Four times a day (QID) | ORAL | Status: DC
  Administered 2017-12-31 – 2018-01-01 (×5): 600 mg via ORAL
  Filled 2017-12-30 (×5): qty 1

## 2017-12-30 MED ORDER — DIPHENHYDRAMINE HCL 25 MG PO CAPS: 25.0000 mg | ORAL_CAPSULE | Freq: Four times a day (QID) | ORAL | Status: DC | PRN

## 2017-12-30 MED ORDER — MEPERIDINE HCL 25 MG/ML IJ SOLN
INTRAMUSCULAR | Status: AC
  Filled 2017-12-30: qty 1

## 2017-12-30 MED ORDER — WITCH HAZEL-GLYCERIN EX PADS: 1.0000 "application " | MEDICATED_PAD | CUTANEOUS | Status: DC | PRN

## 2017-12-30 MED ORDER — BUPIVACAINE IN DEXTROSE 0.75-8.25 % IT SOLN
INTRATHECAL | Status: DC | PRN
  Administered 2017-12-30: 1.6 mg via INTRATHECAL

## 2017-12-30 MED ORDER — SODIUM CHLORIDE 0.9% FLUSH: 3.0000 mL | INTRAVENOUS | Status: DC | PRN

## 2017-12-30 MED ORDER — SIMETHICONE 80 MG PO CHEW: 80.0000 mg | CHEWABLE_TABLET | ORAL | Status: DC | PRN

## 2017-12-30 MED ORDER — MEPERIDINE HCL 25 MG/ML IJ SOLN: 6.2500 mg | INTRAMUSCULAR | Status: DC | PRN

## 2017-12-30 MED ORDER — LIDOCAINE HCL (PF) 1 % IJ SOLN
INTRAMUSCULAR | Status: AC
  Filled 2017-12-30: qty 5

## 2017-12-30 MED ORDER — OXYCODONE HCL 5 MG PO TABS
5.0000 mg | ORAL_TABLET | ORAL | Status: DC | PRN
  Administered 2017-12-31 – 2018-01-01 (×3): 5 mg via ORAL
  Filled 2017-12-30 (×3): qty 1

## 2017-12-30 MED ORDER — PRENATAL MULTIVITAMIN CH
1.0000 | ORAL_TABLET | Freq: Every day | ORAL | Status: DC
  Administered 2017-12-31: 1 via ORAL
  Filled 2017-12-30: qty 1

## 2017-12-30 MED ORDER — TETANUS-DIPHTH-ACELL PERTUSSIS 5-2.5-18.5 LF-MCG/0.5 IM SUSP: 0.5000 mL | Freq: Once | INTRAMUSCULAR | Status: DC

## 2017-12-30 MED ORDER — SCOPOLAMINE 1 MG/3DAYS TD PT72
MEDICATED_PATCH | TRANSDERMAL | Status: AC
  Filled 2017-12-30: qty 1

## 2017-12-30 MED ORDER — ACETAMINOPHEN 325 MG PO TABS
650.0000 mg | ORAL_TABLET | ORAL | Status: DC | PRN
  Administered 2017-12-31: 650 mg via ORAL
  Filled 2017-12-30: qty 2

## 2017-12-30 MED ORDER — FENTANYL CITRATE (PF) 100 MCG/2ML IJ SOLN
INTRAMUSCULAR | Status: AC
Start: 2017-12-30 — End: ?
  Filled 2017-12-30: qty 2

## 2017-12-30 MED ORDER — OXYCODONE HCL 5 MG PO TABS: 10.0000 mg | ORAL_TABLET | ORAL | Status: DC | PRN

## 2017-12-30 SURGICAL SUPPLY — 41 items
APL SKNCLS STERI-STRIP NONHPOA (GAUZE/BANDAGES/DRESSINGS)
BENZOIN TINCTURE PRP APPL 2/3 (GAUZE/BANDAGES/DRESSINGS) IMPLANT
CLAMP CORD UMBIL (MISCELLANEOUS) IMPLANT
CLIP FILSHIE TUBAL LIGA STRL (Clip) ×2 IMPLANT
CLOSURE STERI STRIP 1/2 X4 (GAUZE/BANDAGES/DRESSINGS) ×2 IMPLANT
CLOSURE WOUND 1/2 X4 (GAUZE/BANDAGES/DRESSINGS)
CLOTH BEACON ORANGE TIMEOUT ST (SAFETY) ×3 IMPLANT
DRSG OPSITE POSTOP 4X10 (GAUZE/BANDAGES/DRESSINGS) ×3 IMPLANT
DURAPREP 26ML APPLICATOR (WOUND CARE) ×3 IMPLANT
ELECT REM PT RETURN 9FT ADLT (ELECTROSURGICAL) ×3
ELECTRODE REM PT RTRN 9FT ADLT (ELECTROSURGICAL) ×1 IMPLANT
EXTRACTOR VACUUM KIWI (MISCELLANEOUS) IMPLANT
GLOVE BIO SURGEON ST LM GN SZ9 (GLOVE) ×3 IMPLANT
GLOVE BIOGEL PI IND STRL 7.0 (GLOVE) ×1 IMPLANT
GLOVE BIOGEL PI IND STRL 9 (GLOVE) ×1 IMPLANT
GLOVE BIOGEL PI INDICATOR 7.0 (GLOVE) ×2
GLOVE BIOGEL PI INDICATOR 9 (GLOVE) ×2
GOWN STRL REUS W/TWL 2XL LVL3 (GOWN DISPOSABLE) ×3 IMPLANT
GOWN STRL REUS W/TWL LRG LVL3 (GOWN DISPOSABLE) ×6 IMPLANT
NDL HYPO 25X5/8 SAFETYGLIDE (NEEDLE) IMPLANT
NEEDLE HYPO 25X5/8 SAFETYGLIDE (NEEDLE) IMPLANT
NS IRRIG 1000ML POUR BTL (IV SOLUTION) ×3 IMPLANT
PACK C SECTION WH (CUSTOM PROCEDURE TRAY) ×3 IMPLANT
PAD OB MATERNITY 4.3X12.25 (PERSONAL CARE ITEMS) ×3 IMPLANT
PENCIL SMOKE EVAC W/HOLSTER (ELECTROSURGICAL) ×3 IMPLANT
RETRACTOR TRAXI PANNICULUS (MISCELLANEOUS) IMPLANT
RTRCTR C-SECT PINK 25CM LRG (MISCELLANEOUS) IMPLANT
RTRCTR C-SECT PINK 34CM XLRG (MISCELLANEOUS) IMPLANT
STRIP CLOSURE SKIN 1/2X4 (GAUZE/BANDAGES/DRESSINGS) IMPLANT
SUT MNCRL 0 VIOLET CTX 36 (SUTURE) ×2 IMPLANT
SUT MONOCRYL 0 CTX 36 (SUTURE) ×4
SUT PROLENE 2 0 CT 30 (SUTURE) ×4 IMPLANT
SUT VIC AB 0 CT1 27 (SUTURE) ×3
SUT VIC AB 0 CT1 27XBRD ANBCTR (SUTURE) ×1 IMPLANT
SUT VIC AB 2-0 CT1 27 (SUTURE) ×6
SUT VIC AB 2-0 CT1 TAPERPNT 27 (SUTURE) ×2 IMPLANT
SUT VIC AB 4-0 KS 27 (SUTURE) ×3 IMPLANT
SYR BULB IRRIGATION 50ML (SYRINGE) IMPLANT
TOWEL OR 17X24 6PK STRL BLUE (TOWEL DISPOSABLE) ×3 IMPLANT
TRAXI PANNICULUS RETRACTOR (MISCELLANEOUS) ×2
TRAY FOLEY BAG SILVER LF 14FR (SET/KITS/TRAYS/PACK) ×3 IMPLANT

## 2017-12-30 NOTE — Anesthesia Procedure Notes (Addendum)
Spinal  Patient location during procedure: OR Start time: 12/30/2017 5:52 PM End time: 12/30/2017 5:56 PM Staffing Anesthesiologist: Beryle LatheBrock, Thomas E, MD Performed: anesthesiologist  Preanesthetic Checklist Completed: patient identified, surgical consent, pre-op evaluation, timeout performed, IV checked, risks and benefits discussed and monitors and equipment checked Spinal Block Patient position: sitting Prep: DuraPrep Patient monitoring: heart rate, cardiac monitor, continuous pulse ox and blood pressure Approach: midline Location: L4-5 Injection technique: single-shot Needle Needle type: Pencan  Needle gauge: 24 G Additional Notes Functioning IV was confirmed and monitors were applied. Sterile prep and drape, including hand hygiene, mask, and sterile gloves were used. The patient was positioned and the spine was prepped. The skin was anesthetized with lidocaine. Free flow of clear CSF was obtained prior to injecting local anesthetic into the CSF. The spinal needle aspirated freely following injection. The needle was carefully withdrawn. The patient tolerated the procedure well. Consent was obtained prior to the procedure with all questions answered and concerns addressed. Risks including, but not limited to, bleeding, infection, nerve damage, paralysis, failed block, inadequate analgesia, allergic reaction, high spinal, itching, and headache were discussed and the patient wished to proceed.  Leslye Peerhomas Brock, MD

## 2017-12-30 NOTE — Patient Instructions (Signed)
[name], I greatly value your feedback.  If you receive a survey following your visit with us today, we appreciate you taking the time to fill it out.  Thanks, Joellyn HaffKim , CNM, WHNP-BC   Call the office [phone]) or [facility] if:  You begin to have strong, frequent contractions  Your water breaks.  Sometimes it is a big gush of fluid, sometimes it is just a trickle that keeps getting your panties wet or running down your legs  You have vaginal bleeding.  It is normal to have a small amount of spotting if your cervix was checked.   You don't feel your baby moving like normal.  If you don't, get you something to eat and drink and lay down and focus on feeling your baby move.  You should feel at least 10 movements in 2 hours.  If you don't, you should call the office or [facility]     [facility] Contractions of the uterus can occur throughout pregnancy, but they are not always a sign that you are in labor. You may have practice contractions called Braxton Hicks contractions. These false labor contractions are sometimes confused with true labor. What are Emily PeltonBraxton Hicks contractions? Braxton Hicks contractions are tightening movements that occur in the muscles of the uterus before labor. Unlike true labor contractions, these contractions do not result in opening (dilation) and thinning of the cervix. Toward the end of pregnancy (32-34 weeks), Braxton Hicks contractions can happen more often and may become stronger. These contractions are sometimes difficult to tell apart from true labor because they can be very uncomfortable. You should not feel embarrassed if you go to the hospital with false labor. Sometimes, the only way to tell if you are in true labor is for your health care provider to look for changes in the cervix. The health care provider will do a physical exam and may monitor your contractions. If you are not in true labor, the exam should  show that your cervix is not dilating and your water has not broken. If there are other health problems associated with your pregnancy, it is completely safe for you to be sent home with false labor. You may continue to have Braxton Hicks contractions until you go into true labor. How to tell the difference between true labor and false labor True labor  Contractions last 30-70 seconds.  Contractions become very regular.  Discomfort is usually felt in the top of the uterus, and it spreads to the lower abdomen and low back.  Contractions do not go away with walking.  Contractions usually become more intense and increase in frequency.  The cervix dilates and gets thinner. False labor  Contractions are usually shorter and not as strong as true labor contractions.  Contractions are usually irregular.  Contractions are often felt in the front of the lower abdomen and in the groin.  Contractions may go away when you walk around or change positions while lying down.  Contractions get weaker and are shorter-lasting as time goes on.  The cervix usually does not dilate or become thin. Follow these instructions at home:  Take over-the-counter and prescription medicines only as told by your health care provider.  Keep up with your usual exercises and follow other instructions from your health care provider.  Eat and drink lightly if you think you are going into labor.  If Braxton Hicks contractions are making you uncomfortable: ? Change your position from lying  down or resting to walking, or change from walking to resting. ? Sit and rest in a tub of warm water. ? Drink enough fluid to keep your urine pale yellow. Dehydration may cause these contractions. ? Do slow and deep breathing several times an hour.  Keep all follow-up prenatal visits as told by your health care provider. This is important. Contact a health care provider if:  You have a fever.  You have continuous pain in  your abdomen. Get help right away if:  Your contractions become stronger, more regular, and closer together.  You have fluid leaking or gushing from your vagina.  You pass blood-tinged mucus (bloody show).  You have bleeding from your vagina.  You have low back pain that you never had before.  You feel your baby's head pushing down and causing pelvic pressure.  Your baby is not moving inside you as much as it used to. Summary  Contractions that occur before labor are called Braxton Hicks contractions, false labor, or practice contractions.  Braxton Hicks contractions are usually shorter, weaker, farther apart, and less regular than true labor contractions. True labor contractions usually become progressively stronger and regular and they become more frequent.  Manage discomfort from [facility] contractions by changing position, resting in a warm bath, drinking plenty of water, or practicing deep breathing. This information is not intended to replace advice given to you by your health care provider. Make sure you discuss any questions you have with your health care provider. Document Released: 03/13/2017 Document Revised: 03/13/2017 Document Reviewed: 03/13/2017 Elsevier Interactive Patient Education  2018 [employer].

## 2017-12-30 NOTE — H&P (Deleted)
  The note originally documented on this encounter has been moved the the encounter in which it belongs.  

## 2017-12-30 NOTE — MAU Note (Signed)
Pt states she was seen in office today and had high blood pressure. She states the provider told her that she would be having her c-section done today.

## 2017-12-30 NOTE — H&P (Signed)
Emily Love is a 42 y.o. female presenting with blood pressure elevation and feeling of general malaise at 37 weeks 4 days, meeting criteria for gestational hypertension .Marland Kitchen  She is a [id number] at 37 weeks 4 days who had a reactive NST earlier today at which time blood pressures were systolic on repeat evaluation.  She has had blood pressures at home up to 150s and has had a sense of malaise the last several days urine protein is negative.  She will be admitted PIH labs will be drawn and we will proceed with repeat cesarean section at this time she desires permanent sterilization, does not require Medicaid prior consent forms to be completed and confirms again her desire for permanent sterilization. Patient last ate at 8:30 AM and is currently n.p.o. will be scheduled for 5:30 PM . OB History    Gravida Para Term Preterm AB Living   5 2 2   2 2    SAB TAB Ectopic Multiple Live Births   1   1   2      Past Medical History:  Diagnosis Date  . Abnormal uterine bleeding (AUB) 01/26/2015  . Arthritis    psoriatic  . Complication of anesthesia   . Fatigue 01/26/2015  . Gestational diabetes   . History of PCOS 01/26/2015  . LLQ pain 01/26/2015  . Obesity   . PCOS (polycystic ovarian syndrome)   . Psoriasis   . Spinal headache    both deliveries   Past Surgical History:  Procedure Laterality Date  . CARPAL TUNNEL RELEASE Right 2000  . CESAREAN SECTION  [id number]  . endometrial biopsy    . LAPAROSCOPY FOR ECTOPIC PREGNANCY  1997  . lumph node removed  2007  . TONSILLECTOMY     Family History: family history includes Cancer in her paternal grandmother; Diabetes in her father; Epilepsy in her son; Heart disease in her father; Hypothyroidism in her mother; Narcolepsy in her son; Other in her father and maternal grandfather. Social History:  reports that  has never smoked. she has never used smokeless tobacco. She reports that she does not drink alcohol or use drugs.     Maternal Diabetes:  A1 diabetes, well controlled on diet alone Genetic Screening: Normal Maternal Ultrasounds/Referrals: Normal amniotic fluid index was upper limits normal at [redacted] weeks gestation with normal 2-hour glucose tolerance test Fetal Ultrasounds or other Referrals:  None Maternal Substance Abuse:  No Significant Maternal Medications:  None Significant Maternal Lab Results:  Lab values include: Other: Group B strep not obtained yet Other Comments:  None  ROS History   Last menstrual period 04/11/2017. Exam Physical Exam  Constitutional: She is oriented to person, place, and time. She appears well-developed and well-nourished.  She has a general feeling of malaise.  There been some facial and extremity edema  HENT:  Head: Normocephalic.  Eyes: Pupils are equal, round, and reactive to light.  Neck: Normal range of motion.  Cardiovascular: Normal rate.  Respiratory: Effort normal.  GI: Soft.  Genitourinary: Vagina normal.  Musculoskeletal: Normal range of motion.  Neurological: She is alert and oriented to person, place, and time. She has normal reflexes.  Skin: Skin is warm and dry.  Psychiatric: She has a normal mood and affect. Her behavior is normal. Judgment and thought content normal.    Prenatal labs: ABO, Rh: A/Positive/-- (08/23 1231) Antibody: Negative (12/14 0917) Rubella: <0.90 (08/23 1231) RPR: Non Reactive (12/14 0917)  HBsAg: Negative (08/23 1231)  HIV: Non Reactive (12/14 0917)  GBS:   Not done  Assessment/Plan: Pregnancy at 37 weeks 4 days Gestational hypertension Gestational diabetes diet controlled Desire for elective permanent sterilization  Plan: We will admit obtained PIH labs maintain n.p.o. status with plans for repeat cesarean section later today scheduled at present for 5:30 PM   Tilda BurrowJohn V  12/30/2017, 2:19 PM

## 2017-12-30 NOTE — Op Note (Signed)
12/30/2017  7:27 PM  PATIENT:  Emily Love  42 y.o. female  PRE-OPERATIVE DIAGNOSIS: Gestational hypertension 37 weeks 4 days PRIOR CESAREAN SECTION, ELECTIVE STERILIZATION  POST-OPERATIVE DIAGNOSIS: Gestational hypertension 37 weeks 4 days, delivered PRIOR CESAREAN SECTION, ELECTIVE STERILIZATION transient tachypnea of the newborn  PROCEDURE:  Procedure(s): REPEAT CESAREAN SECTION WITH BILATERAL TUBAL LIGATION (Bilateral)  SURGEON:  Surgeon(s) and Role:    * Tilda BurrowFerguson,  V, MD - Primary    * Degele, Kandra NicolasJulie P, MD - Fellow  PHYSICIAN ASSISTANT:   ASSISTANTS: none   ANESTHESIA:   spinal  EBL:  941 mL   BLOOD ADMINISTERED:none  DRAINS: Urinary Catheter (Foley)   LOCAL MEDICATIONS USED:  NONE  SPECIMEN:  Source of Specimen:  Placenta to labor and delivery  DISPOSITION OF SPECIMEN:    COUNTS:  YES  TOURNIQUET:  * No tourniquets in log *  DICTATION: .Dragon Dictation  PLAN OF CARE: Admit to inpatient   PATIENT DISPOSITION:  PACU - hemodynamically stable.   Delay start of Pharmacological VTE agent (>24hrs) due to surgical blood loss or risk of bleeding: not applicable Indications: Gestational hypertension at 37+ weeks based on repetitive pressures greater than 140, and accompanying malaise resulting in advance of plans for cesarean. Details of procedure patient was taken the operating room prepped and draped for lower abdominal surgery, with Traxi placed to elevate the pannus.  Abdomen was prepped and draped Foley catheter in place spinal anesthesia having been confirmed a successful, and a transverse incision made approximately 12 cm in length through the skin and subcutaneous tissues to the fascia which was opened transversely.  There was quite connective tissue between the rectus muscles which was opened sharply cephalad and caudad and peritoneal cavity entered without difficulty.  The uterus was somewhat mobile and floppy and rotated to the patient's left.  It was  pushed into the midline, bladder flap developed, and transverse uterine incision made sharply extended cephalad and caudad with index finger traction and the fetal vertex delivered through the incision.  There was generous amount of amniotic fluid mild polyhydramnios suspected.  The fetal vertex was delivered through the incision.  The baby did a big gasp with a mouth full of the fluid prior to us being able to suction the nasopharynx.  The nasopharynx was then suctioned, as the baby was resting on the abdomen during the 1 minute wait until cord was clamped Baby was passed to the waiting pediatrician.  Cord was quite thick and the placenta was quite large. Please see the pediatrician's notes for details.  Of note the baby showed some retraction and did not wean from blow-by oxygen so was taken to the NICU for observation for suspected transient tachypnea of the newborn.  The amniotic fluid was clear without any malodor. Placenta was delivered in response to crede uterine massage and IV oxytocin with good uterine tone developed.  The membranes were extracted and the uterus closed in a 2 layer fashion running locking 0 Monocryl followed by continuous running second layer. Hemostasis was good. Sterilization: The uterus could be rotated such that the left fallopian tube was visible to its fimbriated end.  The ovary appeared grossly normal.  There was a white plaque over the old stigma of ovulation but this was considered normal variant. The Falope tube was then grasped elevated, the Filshie clip applied and confirmed as secure.  The opposite tube was treated in similar fashion. Uterine incision was reinspected and confirmed as hemostatic. The peritoneal edges were reapproximated  using running 2-0 Vicryl, the rectus muscles were reapproximated with 2 interrupted sutures of 2-0 Vicryl, then the fascia closed with continuous running 0 Vicryl.  Subcutaneous tissues were mobilized through the old fibrotic scar from  the previous incision, mobilized sufficiently the 2 layers of 3 separate interrupted 2 oh plain sutures were used to reapproximate the subcutaneous fatty tissues.  Subcuticular 4-0 Vicryl thenwas used to reapproximate the skin edges . Steri-Strips with benzoin were applied and then a honeycomb dressing applied. EBL 941 mL

## 2017-12-30 NOTE — Transfer of Care (Signed)
Immediate Anesthesia Transfer of Care Note  Patient: Emily Love  Procedure(s) Performed: REPEAT CESAREAN SECTION WITH BILATERAL TUBAL LIGATION (Bilateral Abdomen)  Patient Location: PACU  Anesthesia Type:Spinal  Level of Consciousness: awake, alert  and oriented  Airway & Oxygen Therapy: Patient Spontanous Breathing  Post-op Assessment: Report given to RN and Post -op Vital signs reviewed and stable BP 117/58, HR 97, RR 14, SaO2 98%  Post vital signs: Reviewed and stable  Last Vitals:  Vitals:   12/30/17 1512  BP: (!) 143/83  Pulse: (!) 108  Resp: 16  Temp: 36.7 C  SpO2: 99%    Last Pain:  Vitals:   12/30/17 1514  TempSrc:   PainSc: 0-No pain         Complications: No apparent anesthesia complications

## 2017-12-30 NOTE — Op Note (Signed)
Please see the brief operative note for surgical details 

## 2017-12-30 NOTE — Progress Notes (Signed)
HIGH-RISK PREGNANCY VISIT Patient name: Emily Love MRN [id number]  Date of birth: 1976/07/31 Chief Complaint:   High Risk Gestation (NST)  History of Present Illness:   Emily Love is a 42 y.o. [id number] female at 6863w4d with an Estimated Date of Delivery: 01/16/18 being seen today for ongoing management of a high-risk pregnancy complicated by AMA, A1DM, GHTN dx today.  Today she reports all sugars are normal. .Denies ha, visual changes, ruq/epigastric pain, n/v.  States she just doesn't feel well this am. Has been checking bp's at home, getting 140s/70s to 150s/80s.  Contractions: Irregular. Vag. Bleeding: None.  Movement: Present. denies leaking of fluid.  Review of Systems:   Pertinent items are noted in HPI Denies abnormal vaginal discharge w/ itching/odor/irritation, headaches, visual changes, shortness of breath, chest pain, abdominal pain, severe nausea/vomiting, or problems with urination or bowel movements unless otherwise stated above. Pertinent History Reviewed:  Reviewed past medical,surgical, social, obstetrical and family history.  Reviewed problem list, medications and allergies. Physical Assessment:   Vitals:   12/30/17 1139 12/30/17 1229  BP: 140/70 (!) 142/78  Pulse: 98   Weight: 239 lb 8 oz (108.6 kg)   Body mass index is 43.81 kg/m.           Physical Examination:   General appearance: alert, well appearing, and in no distress  Mental status: alert, oriented to person, place, and time  Skin: warm & dry   Extremities: Edema: Moderate pitting, indentation subsides rapidly    Cardiovascular: normal heart rate noted  Respiratory: normal respiratory effort, no distress  Abdomen: gravid, soft, non-tender  Pelvic: Cervical exam deferred         Fetal Status: Fetal Heart Rate (bpm): 140 Fundal Height: 41 cm Movement: Present    Fetal Surveillance Testing today: NST: FHR baseline 140 bpm, Variability: moderate, Accelerations:present, Decelerations:  Absent=  Cat 1/Reactive Toco: q 2-535mins, mild     Results for orders placed or performed in visit on 12/30/17 (from the past 24 hour(s))  POCT urinalysis dipstick   Collection Time: 12/30/17 11:40 AM  Result Value Ref Range   Color, UA     Clarity, UA     Glucose, UA neg    Bilirubin, UA     Ketones, UA neg    Spec Grav, UA  1.010 - 1.025   Blood, UA trace    pH, UA  5.0 - 8.0   Protein, UA neg    Urobilinogen, UA  0.2 or 1.0 E.U./dL   Nitrite, UA neg    Leukocytes, UA Negative Negative   Appearance     Odor      Assessment & Plan:  1) High-risk pregnancy [id number] at 5363w4d with an Estimated Date of Delivery: 01/16/18   2) AMA, continue baby asa  3) A1DM, stable  4) GHTN> dx today, had elevated bp @ 34wks, as well as elevated home bp's. Asymptomatic, no proteinuria. Discussed w/ JVF as she is scheduled for RCS 3/1- he saw pt and discussed poc. Last solids @ 0830. [facility], prep for OR, plan RCS @ 1700 today. Judeth HornErin Lawrence, NP notified. Dr. Emelda FearFerguson notified OR. Pt to remain NPO  Meds: No orders of the defined types were placed in this encounter.   Labs/procedures today: nst  Treatment Plan:  2x/wk testing nst alt w/ sono, RCS @ 39wks on 3/1 as scheduled  Reviewed: Term labor symptoms and general obstetric precautions including but not limited to vaginal  bleeding, contractions, leaking of fluid and fetal movement were reviewed in detail with the patient.  All questions were answered.  Follow-up: Return for cancel all upcoming appts, reschedule 1wk for postpartum bp check and incision check, then 4-6wks pp.  Orders Placed This Encounter  Procedures  . POCT urinalysis dipstick   Cheral Marker CNM, [facility] 12/30/2017 1:06 PM

## 2017-12-30 NOTE — Consult Note (Deleted)
Delivery Note:  Asked by Dr Emelda FearFerguson to attend delivery of this baby [facility] Pregnancy complicated by GDM on glyburide, AMA, and PIH. GBS not done. ROM at delivery. Infant had spontaneous respirations at birth. Delayed cord clamping done for 1 min. Infant had good cry on stimulation. Bulb suctioned and dried. He was cyanotic and remained so at 5 min. Sat at 6 min was 80% BBO2 given at 50% to keep sats 88  - 92. Intermittent grunting and subcostal retractions noted. Apgars 8/8/9. Due to continued O2 need after 16 min of age, infant was brought to NICU for further care. He was placed in transport isolette and shown to mom. FOB in attendance. I spoke to both parents regarding transfer and treatment.  Lucillie Garfinkelita Q  MD Neonatologist

## 2017-12-30 NOTE — Anesthesia Preprocedure Evaluation (Addendum)
Anesthesia Evaluation  Patient identified by MRN, date of birth, ID band Patient awake    Reviewed: Allergy & Precautions, NPO status , Patient's Chart, lab work & pertinent test results  History of Anesthesia Complications (+) POST - OP SPINAL HEADACHE  Airway Mallampati: II  TM Distance: >3 FB Neck ROM: Full    Dental  (+) Dental Advisory Given, Chipped,    Pulmonary neg pulmonary ROS,    Pulmonary exam normal breath sounds clear to auscultation       Cardiovascular hypertension, Normal cardiovascular exam Rhythm:Regular Rate:Normal     Neuro/Psych  Headaches, negative psych ROS   GI/Hepatic negative GI ROS, Neg liver ROS,   Endo/Other  diabetes, Well Controlled, GestationalMorbid obesity  Renal/GU negative Renal ROS  negative genitourinary   Musculoskeletal  (+) Arthritis , Scoliosis   Abdominal (+) + obese,   Peds  Hematology negative hematology ROS (+)   Anesthesia Other Findings ANAPHYLAXIS to PCN  Reproductive/Obstetrics (+) Pregnancy PCOS                           Anesthesia Physical Anesthesia Plan  ASA: III  Anesthesia Plan: Spinal   Post-op Pain Management:    Induction:   PONV Risk Score and Plan: Treatment may vary due to age or medical condition  Airway Management Planned: Natural Airway and Nasal Cannula  Additional Equipment: None  Intra-op Plan:   Post-operative Plan:   Informed Consent: I have reviewed the patients History and Physical, chart, labs and discussed the procedure including the risks, benefits and alternatives for the proposed anesthesia with the patient or authorized representative who has indicated his/her understanding and acceptance.   Dental advisory given  Plan Discussed with: CRNA  Anesthesia Plan Comments: (Long discussion had regarding history of two headaches s/p spinals for C-section in past. Per patient, she received a blood patch  following the first, but was managed conservatively with the second. Discussed historically low incidence of postdural puncture headache from spinals using small gage pencil point needles. No records of previous spinals done at another hospital. Chart review reveals several brain MRI and head CT for complaints of "worst headache" and other neurologic complaints, unclear if headaches after spinal were true PDPH given this information. Patient agreeable for spinal anesthetic.)      Anesthesia Quick Evaluation

## 2017-12-31 DIAGNOSIS — Z3A37 37 weeks gestation of pregnancy: Secondary | ICD-10-CM

## 2017-12-31 DIAGNOSIS — Z302 Encounter for sterilization: Secondary | ICD-10-CM

## 2017-12-31 DIAGNOSIS — O134 Gestational [pregnancy-induced] hypertension without significant proteinuria, complicating childbirth: Secondary | ICD-10-CM

## 2017-12-31 DIAGNOSIS — O34211 Maternal care for low transverse scar from previous cesarean delivery: Secondary | ICD-10-CM

## 2017-12-31 LAB — CBC
HCT: 39.2 % (ref 36.0–46.0)
Hemoglobin: 13 g/dL (ref 12.0–15.0)
MCH: 31 pg (ref 26.0–34.0)
MCHC: 33.2 g/dL (ref 30.0–36.0)
MCV: 93.3 fL (ref 78.0–100.0)
PLATELETS: 240 10*3/uL (ref 150–400)
RBC: 4.2 MIL/uL (ref 3.87–5.11)
RDW: 14.6 % (ref 11.5–15.5)
WBC: 16.4 10*3/uL — AB (ref 4.0–10.5)

## 2017-12-31 LAB — RPR: RPR Ser Ql: NONREACTIVE

## 2017-12-31 LAB — GLUCOSE, CAPILLARY
GLUCOSE-CAPILLARY: 86 mg/dL (ref 65–99)
Glucose-Capillary: 107 mg/dL — ABNORMAL HIGH (ref 65–99)

## 2017-12-31 NOTE — Lactation Note (Signed)
This note was copied from a baby's chart. Lactation Consultation Note  Patient Name: Emily Love [id number]'s Date: 12/31/2017   Mom is a P3, on Lovenox (L2). Mom was found to have been pumping with size 27 flanges. Her nipple diameter suggests that a size 24 would be better. Mom says she feels comfortable with hand expression.    Mom was provided colostrum stickers, NICU booklet, and toothbrush to aid in washing pump parts.   Mom had expressed a few ml of colostrum, but it had been out at room temperature for 5 hours (greater than the NICU recommendation).   Mom appeared to have been recently crying or getting ready to cry. Mom does not have any questions at this time.   Lurline HareRichey,  [facility] 12/31/2017, 1:22 PM

## 2017-12-31 NOTE — Progress Notes (Signed)
Subjective: Postpartum Day 1: [facility]d [facility] Patient reports tolerating PO and no problems voiding.  Denies headaches, visual symptoms, RUQ/epigastric pain. Baby is doing well in NICU, admitted for TTN. Breastfeeding.   Objective: Vital signs in last 24 hours: Temp:  [97.6 F (36.4 C)-98.7 F (37.1 C)] 97.6 F (36.4 C) (02/20 0800) Pulse Rate:  [72-108] 72 (02/20 0800) Resp:  [16-28] 20 (02/20 0800) BP: (109-143)/(56-83) 109/56 (02/20 0800) SpO2:  [95 %-100 %] 98 % (02/20 0800) Weight:  [237 lb 12 oz (107.8 kg)-239 lb 8 oz (108.6 kg)] 237 lb 12 oz (107.8 kg) (02/19 1512)  Patient Vitals for the past 24 hrs:  BP Temp Temp src Pulse Resp SpO2 Height Weight  12/31/17 0800 (!) 109/56 97.6 F (36.4 C) Oral 72 20 98 % - -  12/31/17 0405 109/67 98.7 F (37.1 C) Oral 73 18 98 % - -  12/30/17 2355 138/65 97.9 F (36.6 C) Oral 73 20 99 % - -  12/30/17 2200 131/62 97.8 F (36.6 C) Oral 91 20 100 % - -  12/30/17 2105 (!) 132/59 98.7 F (37.1 C) Oral 100 20 95 % - -  12/30/17 2030 137/72 98 F (36.7 C) - 99 (!) 22 97 % - -  12/30/17 2015 121/69 - - 96 19 97 % - -  12/30/17 2000 119/66 97.9 F (36.6 C) - 98 (!) 23 97 % - -  12/30/17 1945 121/71 - - 95 (!) 28 96 % - -  12/30/17 1935 - - - 96 - 97 % - -  12/30/17 1934 (!) 117/58 98.5 F (36.9 C) - - 20 - - -  12/30/17 1512 (!) 143/83 98 F (36.7 C) Oral (!) 108 16 99 % 5\' 2"  (1.575 m) 237 lb 12 oz (107.8 kg)   Physical Exam:  General: alert and no distress Lochia: appropriate Uterine Fundus: firm Incision: healing well, no significant drainage DVT Evaluation: No evidence of DVT seen on physical exam.  Recent Labs    12/30/17 1520 12/31/17 0555  HGB 14.1 13.0  HCT 41.5 39.2    Assessment/Plan: Status post RLTCS and BTL. Doing well postoperatively.  Stable BP for now Stable Hgb Continue current care.  Jaynie CollinsUgonna , MD 12/31/2017, 10:21 AM

## 2017-12-31 NOTE — Progress Notes (Signed)
Baby still in NICU , on O2 supplementation of 21-30%.  Observation made at delivery that skin creases of feet and scrotum less than expected by gestational age . A review of LMP and Dating u/s done at 9 wk 5 days showed correlation within the 5 day margin leaving the EDD unchanged.

## 2017-12-31 NOTE — Anesthesia Postprocedure Evaluation (Signed)
Anesthesia Post Note  Patient: Emily Love  Procedure(s) Performed: REPEAT CESAREAN SECTION WITH BILATERAL TUBAL LIGATION (Bilateral Abdomen)     Patient location during evaluation: PACU Anesthesia Type: Spinal Level of consciousness: awake Pain management: satisfactory to patient Vital Signs Assessment: post-procedure vital signs reviewed and stable Respiratory status: spontaneous breathing Cardiovascular status: blood pressure returned to baseline Postop Assessment: no headache and spinal receding Anesthetic complications: no    Last Vitals:  Vitals:   12/30/17 2355 12/31/17 0405  BP: 138/65 109/67  Pulse: 73 73  Resp: 20 18  Temp: 36.6 C 37.1 C  SpO2: 99% 98%    Last Pain:  Vitals:   12/31/17 0600  TempSrc:   PainSc: 4    Pain Goal:                 [name], [name]

## 2018-01-01 ENCOUNTER — Encounter (HOSPITAL_COMMUNITY): Payer: Self-pay | Admitting: Obstetrics and Gynecology

## 2018-01-01 ENCOUNTER — Telehealth: Payer: Self-pay | Admitting: Obstetrics and Gynecology

## 2018-01-01 LAB — GLUCOSE, CAPILLARY: GLUCOSE-CAPILLARY: 98 mg/dL (ref 65–99)

## 2018-01-01 MED ORDER — DOCUSATE SODIUM 100 MG PO CAPS: 100.0000 mg | ORAL_CAPSULE | Freq: Two times a day (BID) | ORAL | 2 refills | Status: DC | PRN

## 2018-01-01 MED ORDER — IBUPROFEN 600 MG PO TABS: 600.0000 mg | ORAL_TABLET | Freq: Four times a day (QID) | ORAL | 0 refills | Status: DC

## 2018-01-01 MED ORDER — OXYCODONE-ACETAMINOPHEN 5-325 MG PO TABS: 1.0000 | ORAL_TABLET | ORAL | 0 refills | Status: DC | PRN

## 2018-01-01 NOTE — Discharge Summary (Signed)
Patient will make appointment next week to have her incision checked

## 2018-01-01 NOTE — Progress Notes (Signed)
Discharge teaching complete with pt. Honeycomb dressing changes per MD order. Pt has breast pump at home. Pt discharged home to family.

## 2018-01-01 NOTE — Telephone Encounter (Signed)
Patient was discharged home today the baby may be able to go home tomorrow.  Patient contacted by phone to let her know we will see her soon and are thinking of her and her baby.

## 2018-01-01 NOTE — Discharge Instructions (Signed)

## 2018-01-02 ENCOUNTER — Other Ambulatory Visit: Payer: BLUE CROSS/BLUE SHIELD

## 2018-01-02 ENCOUNTER — Encounter: Payer: BLUE CROSS/BLUE SHIELD | Admitting: Obstetrics & Gynecology

## 2018-01-02 NOTE — Discharge Summary (Signed)
OB Discharge Summary     Patient Name: Emily Love DOB: 04-Nov-1976 MRN: [id number]  Date of admission: 12/30/2017 Delivering MD: Tilda Burrow   Date of discharge: 01/02/2018  Admitting diagnosis: 37WKS CSECTION Intrauterine pregnancy: [redacted]w[redacted]d     Secondary diagnosis:  Principal Problem:   Status post repeat low transverse cesarean section Active Problems:   AMA (advanced maternal age) multigravida 35+   Previous cesarean section complicating pregnancy   Rubella non-immune status, antepartum   Gestational diabetes mellitus, class A1   Gestational hypertension without significant proteinuria     Discharge diagnosis: Term Pregnancy Delivered and Gestational Hypertension                                                                                                Post partum procedures:  Tubal ligation  Complications: None  Hospital course:  Sceduled C/S   42 y.o. yo [id number] at [redacted]w[redacted]d was admitted to the hospital 12/30/2017 for scheduled cesarean section with the following indication: GHTN at term.  Membrane Rupture Time/Date: 6:24 PM ,12/30/2017   Patient delivered a Viable infant.12/30/2017  Details of operation can be found in separate operative note.  Pateint had an uncomplicated postpartum course.  She is ambulating, tolerating a regular diet, passing flatus, and urinating well. BP was stable, no progression to preeclampsia. Patient is discharged home in stable condition on  01/02/18         Physical exam  Vitals:   12/31/17 1630 12/31/17 2047 01/01/18 0043 01/01/18 0634  BP:   (!) 114/51 (!) 124/58  Pulse: 74 68 78 85  Resp: 20 20 20 20   Temp: 98.3 F (36.8 C) 97.8 F (36.6 C) 98.1 F (36.7 C) 98 F (36.7 C)  TempSrc: Oral Oral Oral Oral  SpO2: 98% 100% 98% 98%  Weight:      Height:       General: alert, cooperative and no distress Lochia: appropriate Uterine Fundus: firm Incision: Healing well with no significant drainage, No significant erythema,  Dressing is clean, dry, and intact DVT Evaluation: No evidence of DVT seen on physical exam. Negative Homan's sign. No cords or calf tenderness. Labs: Lab Results  Component Value Date   WBC 16.4 (H) 12/31/2017   HGB 13.0 12/31/2017   HCT 39.2 12/31/2017   MCV 93.3 12/31/2017   PLT 240 12/31/2017   CMP Latest Ref Rng & Units 12/30/2017  Glucose 65 - 99 mg/dL 86  BUN 6 - 20 mg/dL 9  Creatinine 1.47 - 8.29 mg/dL 5.62  Sodium 130 - 865 mmol/L 135  Potassium 3.5 - 5.1 mmol/L 4.0  Chloride 101 - 111 mmol/L 109  CO2 22 - 32 mmol/L 18(L)  Calcium 8.9 - 10.3 mg/dL 9.1  Total Protein 6.5 - 8.1 g/dL 6.5  Total Bilirubin 0.3 - 1.2 mg/dL 0.6  Alkaline Phos 38 - 126 U/L 270(H)  AST 15 - 41 U/L 21  ALT 14 - 54 U/L 15    Discharge instruction: per After Visit Summary and "Baby and Me Booklet".  After visit meds:  Allergies as of 01/01/2018  Reactions   Penicillins Anaphylaxis   Can't breath Has patient had a PCN reaction causing immediate rash, facial/tongue/throat swelling, SOB or lightheadedness with hypotension: Yes Has patient had a PCN reaction causing severe rash involving mucus membranes or skin necrosis: No Has patient had a PCN reaction that required hospitalization: No Has patient had a PCN reaction occurring within the last 10 years: No If all of the above answers are "NO", then may proceed with Cephalosporin use.   Humira [adalimumab] Hives      Medication List    TAKE these medications   albuterol 108 (90 Base) MCG/ACT inhaler Commonly known as:  PROVENTIL HFA;VENTOLIN HFA Inhale 2 puffs into the lungs every 6 (six) hours as needed for wheezing or shortness of breath.   ALKA-SELTZER PLUS COLD PO Take 1 Dose by mouth as needed.   aspirin EC 81 MG tablet Take 81 mg by mouth daily.   calcium carbonate 500 MG chewable tablet Commonly known as:  TUMS - dosed in mg elemental calcium Chew 1 tablet by mouth 2 (two) times daily as needed for indigestion or  heartburn.   CENTRUM ADULTS PO Take 1 tablet by mouth daily.   docusate sodium 100 MG capsule Commonly known as:  COLACE Take 1 capsule (100 mg total) by mouth 2 (two) times daily as needed for mild constipation or moderate constipation.   folic acid 400 MCG tablet Commonly known as:  FOLVITE Take 400 mcg by mouth daily.   ibuprofen 600 MG tablet Commonly known as:  ADVIL,MOTRIN Take 1 tablet (600 mg total) by mouth every 6 (six) hours.   nystatin 100000 UNIT/ML suspension Commonly known as:  MYCOSTATIN Take 5 mLs (500,000 Units total) by mouth 4 (four) times daily. Swish and hold as long as possible, then swallow   oxyCODONE-acetaminophen 5-325 MG tablet Commonly known as:  PERCOCET/ROXICET Take 1 tablet by mouth every 4 (four) hours as needed for severe pain.       Diet: routine diet  Activity: Advance as tolerated. Pelvic rest for 6 weeks.   Follow up Appt: Future Appointments  Date Time Provider Department Center  01/06/2018  2:45 PM Cresenzo-Dishmon, Scarlette CalicoFrances, CNM [facility]   Follow up Visit:No Follow-up on file.  Postpartum contraception: Tubal Ligation  Newborn Data: Live born female  Birth Weight: 7 lb 15 oz (3600 g) APGAR: 8, 8  Newborn Delivery   Birth date/time:  12/30/2017 18:25:00 Delivery type:  C-Section, Low Transverse C-section categorization:  Repeat     Baby Feeding: Breast Disposition:home with mother   01/02/2018 Jaynie CollinsUgonna , MD

## 2018-01-06 ENCOUNTER — Ambulatory Visit: Payer: BLUE CROSS/BLUE SHIELD | Admitting: Advanced Practice Midwife

## 2018-01-06 ENCOUNTER — Encounter: Payer: Self-pay | Admitting: *Deleted

## 2018-01-06 ENCOUNTER — Other Ambulatory Visit: Payer: BLUE CROSS/BLUE SHIELD | Admitting: Advanced Practice Midwife

## 2018-01-07 ENCOUNTER — Encounter (HOSPITAL_COMMUNITY): Payer: Self-pay | Admitting: Emergency Medicine

## 2018-01-07 ENCOUNTER — Encounter (HOSPITAL_COMMUNITY): Payer: Self-pay | Admitting: *Deleted

## 2018-01-07 ENCOUNTER — Ambulatory Visit (INDEPENDENT_AMBULATORY_CARE_PROVIDER_SITE_OTHER): Payer: BLUE CROSS/BLUE SHIELD | Admitting: Obstetrics and Gynecology

## 2018-01-07 ENCOUNTER — Emergency Department (HOSPITAL_COMMUNITY)
Admission: EM | Admit: 2018-01-07 | Discharge: 2018-01-07 | Disposition: A | Discharge status: Home / Self Care | Payer: BLUE CROSS/BLUE SHIELD | Attending: Emergency Medicine | Admitting: Emergency Medicine

## 2018-01-07 ENCOUNTER — Encounter: Payer: Self-pay | Admitting: Obstetrics and Gynecology

## 2018-01-07 ENCOUNTER — Other Ambulatory Visit: Payer: Self-pay

## 2018-01-07 VITALS — BP 132/86 | HR 111 | Ht 62.0 in | Wt 214.0 lb

## 2018-01-07 DIAGNOSIS — Z7982 Long term (current) use of aspirin: Secondary | ICD-10-CM | POA: Insufficient documentation

## 2018-01-07 DIAGNOSIS — O900 Disruption of cesarean delivery wound: Secondary | ICD-10-CM | POA: Diagnosis not present

## 2018-01-07 DIAGNOSIS — N99842 Postprocedural seroma of a genitourinary system organ or structure following a genitourinary system procedure: Secondary | ICD-10-CM | POA: Diagnosis not present

## 2018-01-07 DIAGNOSIS — O9089 Other complications of the puerperium, not elsewhere classified: Secondary | ICD-10-CM | POA: Insufficient documentation

## 2018-01-07 DIAGNOSIS — O135 Gestational [pregnancy-induced] hypertension without significant proteinuria, complicating the puerperium: Secondary | ICD-10-CM | POA: Insufficient documentation

## 2018-01-07 DIAGNOSIS — Z9889 Other specified postprocedural states: Secondary | ICD-10-CM

## 2018-01-07 DIAGNOSIS — Z79899 Other long term (current) drug therapy: Secondary | ICD-10-CM | POA: Insufficient documentation

## 2018-01-07 MED ORDER — SULFAMETHOXAZOLE-TRIMETHOPRIM 400-80 MG PO TABS: 1.0000 | ORAL_TABLET | Freq: Two times a day (BID) | ORAL | 0 refills | Status: DC

## 2018-01-07 NOTE — Progress Notes (Signed)
   Subjective:  Emily Love is a 42 y.o. female now 8 days status post C/S with BTL. She is breastfeeding.    Review of Systems Negative except bleeding and discharge present at incision site. She went to [facility] yesterday. She denies any pain associated with the site.   Diet:   normal   Bowel movements : normal.  The patient is not having any pain.  Objective:  BP 132/86 (BP Location: Right Arm, Patient Position: Sitting, Cuff Size: Large)   Pulse (!) 111   Ht 5\' 2"  (1.575 m)   Wt 214 lb (97.1 kg)   Breastfeeding? Yes   BMI 39.14 kg/m  General:Well developed, well nourished.  No acute distress. Abdomen: Bowel sounds normal, soft, non-tender. Pelvic Exam: Not indicated  Incision(s):   Healing well, light pink odorless fluid coming out from 1 cm opening from left end of incision, completely non tender, no erythema, no hernia, no swelling, no dehiscence,    Assessment:  Post-Op 8 day s/p C/S with BTL    Doing well postoperatively. Wound seroma present, does not look infected, some fluid expressed with palpation. Dressing placed over incision   Plan:  1.Wound care discussed  2. current medications. Prescribe Septra BID x 7 days 3. Activity restrictions: no lifting more than 25 pounds , patient is ok to drive if not taking pain medications 4. return to work: 4 weeks. 5. Follow up in 1 weeks.   By signing my name below, I, Izna Ahmed, attest that this documentation has been prepared under the direction and in the presence of Tilda Burrow,  V, MD. Electronically Signed: Redge GainerIzna Ahmed, Medical Scribe. 01/07/18. 1:49 PM.  I personally performed the services described in this documentation, which was SCRIBED in my presence. The recorded information has been reviewed and considered accurate. It has been edited as necessary during review. Tilda Burrow V , MD

## 2018-01-07 NOTE — Discharge Instructions (Signed)
Follow-up with Dr. Emelda FearFerguson this afternoon as scheduled.

## 2018-01-07 NOTE — ED Provider Notes (Signed)
[facility] Provider Note   CSN: [id number] Arrival date & time: 01/07/18  0455     History   Chief Complaint Chief Complaint  Patient presents with  . Wound Dehiscence    HPI Emily Love is a 42 y.o. female.  Patient is a 42 year old female with history of polycystic ovaries, status post C-section 5 days ago.  She presents today for evaluation of possible wound dehiscence.  She was asleep this morning when she woke up with a wet sensation to her abdomen and underwear.  She noticed a lot of red fluid underneath the dressing that had been applied post surgery.  She denies any significant pain or discomfort.   The history is provided by the patient.    Past Medical History:  Diagnosis Date  . Abnormal uterine bleeding (AUB) 01/26/2015  . Arthritis    psoriatic  . Complication of anesthesia   . Fatigue 01/26/2015  . Gestational diabetes   . History of PCOS 01/26/2015  . LLQ pain 01/26/2015  . Obesity   . PCOS (polycystic ovarian syndrome)   . Psoriasis   . Spinal headache    both deliveries    Patient Active Problem List   Diagnosis Date Noted  . Gestational hypertension without significant proteinuria 12/30/2017  . Status post repeat low transverse cesarean section 12/30/2017  . Rubella non-immune status, antepartum 07/04/2017  . Supervision of high risk pregnancy, antepartum 07/03/2017  . AMA (advanced maternal age) multigravida 35+ 07/03/2017  . Previous cesarean section complicating pregnancy 07/03/2017  . Psoriasis & psoriatic arthritis 07/03/2017  . LLQ pain 01/26/2015  . Abnormal uterine bleeding (AUB) 01/26/2015  . History of PCOS 01/26/2015  . Fatigue 01/26/2015    Past Surgical History:  Procedure Laterality Date  . CARPAL TUNNEL RELEASE Right 2000  . CESAREAN SECTION  [id number]  . CESAREAN SECTION WITH BILATERAL TUBAL LIGATION Bilateral 12/30/2017   Procedure: REPEAT CESAREAN SECTION WITH BILATERAL TUBAL LIGATION;  Surgeon:  Tilda BurrowFerguson, John V, MD;  [facility];  Service: Obstetrics;  Laterality: Bilateral;  . endometrial biopsy    . LAPAROSCOPY FOR ECTOPIC PREGNANCY  1997  . lumph node removed  2007  . TONSILLECTOMY      OB History    Gravida Para Term Preterm AB Living   5 2 2   2 2    SAB TAB Ectopic Multiple Live Births   1   1   2        Home Medications    Prior to Admission medications   Medication Sig Start Date End Date Taking? Authorizing Provider  albuterol (PROVENTIL HFA;VENTOLIN HFA) 108 (90 Base) MCG/ACT inhaler Inhale 2 puffs into the lungs every 6 (six) hours as needed for wheezing or shortness of breath. 12/19/17   Lazaro ArmsEure, Luther H, MD  aspirin EC 81 MG tablet Take 81 mg by mouth daily.    [provider]  calcium carbonate (TUMS - DOSED IN MG ELEMENTAL CALCIUM) 500 MG chewable tablet Chew 1 tablet by mouth 2 (two) times daily as needed for indigestion or heartburn.    [provider]  Chlorphen-Phenyleph-ASA (ALKA-SELTZER PLUS COLD PO) Take 1 Dose by mouth as needed.     [provider]  docusate sodium (COLACE) 100 MG capsule Take 1 capsule (100 mg total) by mouth 2 (two) times daily as needed for mild constipation or moderate constipation. 01/01/18   Anyanwu, Jethro BastosUgonna A, MD  folic acid (FOLVITE) 400 MCG tablet Take 400 mcg by mouth  daily.    [provider]  ibuprofen (ADVIL,MOTRIN) 600 MG tablet Take 1 tablet (600 mg total) by mouth every 6 (six) hours. 01/01/18   Anyanwu, Jethro Bastos, MD  Multiple Vitamins-Minerals (CENTRUM ADULTS PO) Take 1 tablet by mouth daily.     [provider]  nystatin (MYCOSTATIN) 100000 UNIT/ML suspension Take 5 mLs (500,000 Units total) by mouth 4 (four) times daily. Swish and hold as long as possible, then swallow Patient not taking: Reported on 12/30/2017 12/26/17   Cheral Marker, CNM  oxyCODONE-acetaminophen (PERCOCET/ROXICET) 5-325 MG tablet Take 1 tablet by mouth every 4 (four) hours as needed for severe  pain. 01/01/18   Tereso Newcomer, MD    Family History Family History  Problem Relation Age of Onset  . Hypothyroidism Mother   . Diabetes Father   . Heart disease Father   . Other Father        "something wrong with bones"  . Other Maternal Grandfather        brain tumor  . Cancer Paternal Grandmother        liver  . Epilepsy Son   . Narcolepsy Son     Social History Social History   Tobacco Use  . Smoking status: Never Smoker  . Smokeless tobacco: Never Used  Substance Use Topics  . Alcohol use: No  . Drug use: No     Allergies   Penicillins and Humira [adalimumab]   Review of Systems Review of Systems  All other systems reviewed and are negative.    Physical Exam Updated Vital Signs BP (!) 151/87 (BP Location: Left Arm)   Pulse 90   Temp 97.7 F (36.5 C) (Oral)   Resp 18   Ht 5\' 2"  (1.575 m)   Wt 108.9 kg (240 lb)   SpO2 99%   BMI 43.90 kg/m   Physical Exam  Constitutional: She is oriented to person, place, and time. She appears well-developed and well-nourished. No distress.  HENT:  Head: Normocephalic and atraumatic.  Neck: Normal range of motion. Neck supple.  Abdominal:  The dressing was removed and the wound inspected.  It appears to be well approximated with a very small defect to the side of the incision on the patient's left.  There is no redness or erythema.  There are Steri-Strips remaining, however these are saturated and coming off.  Neurological: She is alert and oriented to person, place, and time.  Skin: Skin is warm and dry. She is not diaphoretic.  Nursing note and vitals reviewed.    ED Treatments / Results  Labs (all labs ordered are listed, but only abnormal results are displayed) Labs Reviewed - No data to display  EKG  EKG Interpretation None       Radiology No results found.  Procedures Procedures (including critical care time)  Medications Ordered in ED Medications - No data to display   Initial  Impression / Assessment and Plan / ED Course  I have reviewed the triage vital signs and the nursing notes.  Pertinent labs & imaging results that were available during my care of the patient were reviewed by me and considered in my medical decision making (see chart for details).  This appears to be a wound seroma.  The incision itself does not appear to be infected.  There is no redness or erythema.  There is no pus draining from the wound.  I have discussed this with Dr. Gust Rung from OB/GYN who is in agreement that the  patient can have a new dressing applied and should follow-up this afternoon as scheduled with Dr. Emelda Fear.  Final Clinical Impressions(s) / ED Diagnoses   Final diagnoses:  None    ED Discharge Orders    None       Geoffery Lyons, MD 01/07/18 [phone]

## 2018-01-07 NOTE — ED Triage Notes (Signed)
Pt had c section on 2/19 and noticed bleeding this am.

## 2018-01-08 ENCOUNTER — Telehealth: Payer: Self-pay | Admitting: *Deleted

## 2018-01-08 ENCOUNTER — Encounter (HOSPITAL_COMMUNITY)
Admission: RE | Admit: 2018-01-08 | Discharge: 2018-01-08 | Disposition: A | Discharge status: Home / Self Care | Payer: BLUE CROSS/BLUE SHIELD | Source: Ambulatory Visit | Attending: Internal Medicine | Admitting: Internal Medicine

## 2018-01-08 HISTORY — DX: Other reaction to spinal and lumbar puncture: G97.1

## 2018-01-08 HISTORY — DX: Other complications of anesthesia, initial encounter: T88.59XA

## 2018-01-08 HISTORY — DX: Adverse effect of unspecified anesthetic, initial encounter: T41.45XA

## 2018-01-08 HISTORY — DX: Gestational diabetes mellitus in pregnancy, unspecified control: O24.419

## 2018-01-08 NOTE — Telephone Encounter (Signed)
Patient states she is certain she is having an allergic reaction to the Bactrim. She noticed itching about 20 minutes after taking the medication. She took some Benadryl. Advised patient to stop taking the medication and to talk with him tomorrow regarding changing the antibiotic. Also encouraged to use hydrocortisone cream to the areas. Verbalized understanding.

## 2018-01-09 ENCOUNTER — Inpatient Hospital Stay (HOSPITAL_COMMUNITY)
Admission: RE | Admit: 2018-01-09 | Payer: BLUE CROSS/BLUE SHIELD | Source: Ambulatory Visit | Admitting: Obstetrics and Gynecology

## 2018-01-09 ENCOUNTER — Ambulatory Visit (INDEPENDENT_AMBULATORY_CARE_PROVIDER_SITE_OTHER): Payer: BLUE CROSS/BLUE SHIELD | Admitting: Obstetrics and Gynecology

## 2018-01-09 ENCOUNTER — Encounter: Payer: Self-pay | Admitting: Obstetrics and Gynecology

## 2018-01-09 VITALS — BP 110/70 | HR 94 | Ht 62.0 in | Wt 210.2 lb

## 2018-01-09 DIAGNOSIS — Z9889 Other specified postprocedural states: Secondary | ICD-10-CM

## 2018-01-09 DIAGNOSIS — O9089 Other complications of the puerperium, not elsewhere classified: Secondary | ICD-10-CM

## 2018-01-09 DIAGNOSIS — Z09 Encounter for follow-up examination after completed treatment for conditions other than malignant neoplasm: Secondary | ICD-10-CM

## 2018-01-09 MED ORDER — NYSTATIN 100000 UNIT/GM EX CREA
1.0000 | TOPICAL_CREAM | Freq: Two times a day (BID) | CUTANEOUS | 1 refills | Status: DC
Start: 2018-01-09 — End: 2018-03-04

## 2018-01-09 NOTE — Progress Notes (Signed)
Patient ID: Emily Love, female   DOB: 1976/03/28, 42 y.o.   MRN: [id number]    Subjective:  Emily Love is a 42 y.o. female now 1 weeks status post REPEAT CESAREAN SECTION WITH BILATERAL TUBAL LIGATION.  Overall, she is doing well. However she notes some drainage around her c-section scar.  The drainage stopped 2 days ago she still has the superficial skin erythema that I think is a yeast infection.  Additionally she had some reaction which she considered due to the antibiotic I given her to prevent wound infection.  She stopped that antibiotic.  She is stable at present and does not need any systemic or oral antibiotics at this point.  Review of Systems Negative except skin infection around c-section scar   Diet:   normal   Bowel movements : normal.  The patient is not having any pain.  Objective:  BP 110/70 (BP Location: Right Arm, Patient Position: Sitting, Cuff Size: Large)   Pulse 94   Ht 5\' 2"  (1.575 m)   Wt 210 lb 3.2 oz (95.3 kg)   Breastfeeding? Yes   BMI 38.45 kg/m  General:Well developed, well nourished.  No acute distress. Abdomen: Bowel sounds normal, soft, non-tender. Pelvic Exam:    DEFERRED  Incision(s):   Healing well, mild drainage, no erythema, no hernia, no swelling, no dehiscence,     Assessment:  1. Post-Op 1 weeks s/p REPEAT CESAREAN SECTION WITH BILATERAL TUBAL LIGATION  2. Seroma, improved essentially resolved 3. Monila skin infection around c-section scar 4. Doing well postoperatively.   Plan:  1.Wound care discussed   2. Rx nystatin  3. Activity restrictions: no lifting more than 15 pounds 4. return to work: 4 weeks. 5. Follow up in 4 weeks.  By signing my name below, I, Diona BrownerJennifer Gorman, attest that this documentation has been prepared under the direction and in the presence of Tilda Burrow,  V, MD. Electronically Signed: Diona BrownerJennifer Gorman, Medical Scribe. 01/09/18. 11:01 AM.  I personally performed the services described in this  documentation, which was SCRIBED in my presence. The recorded information has been reviewed and considered accurate. It has been edited as necessary during review. Tilda Burrow V , MD

## 2018-01-12 ENCOUNTER — Ambulatory Visit: Payer: BLUE CROSS/BLUE SHIELD | Admitting: Obstetrics and Gynecology

## 2018-01-12 ENCOUNTER — Encounter: Payer: Self-pay | Admitting: Obstetrics and Gynecology

## 2018-01-12 ENCOUNTER — Ambulatory Visit (INDEPENDENT_AMBULATORY_CARE_PROVIDER_SITE_OTHER): Payer: BLUE CROSS/BLUE SHIELD | Admitting: Obstetrics and Gynecology

## 2018-01-12 VITALS — BP 126/60 | HR 84 | Temp 98.9°F | Ht 62.0 in | Wt 211.0 lb

## 2018-01-12 DIAGNOSIS — O9089 Other complications of the puerperium, not elsewhere classified: Secondary | ICD-10-CM | POA: Diagnosis not present

## 2018-01-12 DIAGNOSIS — Z9889 Other specified postprocedural states: Secondary | ICD-10-CM

## 2018-01-12 DIAGNOSIS — L304 Erythema intertrigo: Secondary | ICD-10-CM | POA: Insufficient documentation

## 2018-01-12 MED ORDER — CIPROFLOXACIN HCL 500 MG PO TABS: 500.0000 mg | ORAL_TABLET | Freq: Two times a day (BID) | ORAL | 0 refills | Status: DC

## 2018-01-12 MED ORDER — FLUCONAZOLE 100 MG PO TABS: 150.0000 mg | ORAL_TABLET | Freq: Once | ORAL | 1 refills | Status: AC

## 2018-01-12 NOTE — Progress Notes (Signed)
Patient ID: Emily Love, female   DOB: 11-19-1975, 42 y.o.   MRN: [id number]    Subjective:  Emily Love is a 42 y.o. female now 2 weeks status post REPEAT CESAREAN SECTION WITH BILATERAL TUBAL LIGATION.  For a wound recheck after having spontaneous drainage of wound seroma from left end of incision last week.  The left side has stopped draining, but there's some soreness at the right end of incision. Skin has splotches of erythema c/w tinea corporis. Pt has been trying to keep area dry with rolled washcloth. She has used topical nystatin. The patient has been using nystatin cream that was prescribed to her on Friday, 01/09/2018, twice a day. She states getting a fever on Saturday, 01/10/2018 and adds she has not been felling well since. She also reports a foul odor to her wound. She has been washing her wound once a day.  Review of Systems Negative except fever and foul odor to her wound   Diet:   normal   Bowel movements : normal.  The patient is not having any pain.  Objective:  BP 126/60 (BP Location: Left Arm, Patient Position: Sitting, Cuff Size: Normal)   Pulse 84   Temp 98.9 F (37.2 C) (Oral)   Ht 5\' 2"  (1.575 m)   Wt 211 lb (95.7 kg)   Breastfeeding? Yes   BMI 38.59 kg/m  General:Well developed, well nourished.  No acute distress. Abdomen: Bowel sounds normal, soft, non-tender., small seroma to the right  See HPI  Pelvic Exam: DEFERRED  Incision(s):   rash around incision and clear non-purulent fluid aspirated from incision   PROCEDURE: Verbal consent was obtained after explaining to the patient what the plan was, and moving pt to procedure room.. The patient was then prepped with betadine and numbed with 1% lidocaine.; 16cc of fluid was removed. Some residual firmness persisted on the right.   Assessment:  1. Post-Op 2 weeks s/p REPEAT CESAREAN SECTION WITH BILATERAL TUBAL LIGATION  2. Seroma of c-section scar 3. intertrigo beneath the panus ,Superficial  skin infection, suspect yeast    Plan:  1. Wound care discussed   2. Rx Diflucan x 7 days, Rx Cipro 500 bid x 7 d 3. Activity restrictions: breast feeding until the end of the week 4. return to work: 2-3 weeks. 5. Follow up in 1 weeks.   By signing my name below, I, Diona BrownerJennifer Gorman, attest that this documentation has been prepared under the direction and in the presence of Tilda Burrow,  V, MD. Electronically Signed: Diona BrownerJennifer Gorman, Medical Scribe. 01/12/18. 11:36 AM.  I personally performed the services described in this documentation, which was SCRIBED in my presence. The recorded information has been reviewed and considered accurate. It has been edited as necessary during review. Tilda Burrow V , MD

## 2018-01-12 NOTE — Assessment & Plan Note (Signed)
Rx Diflucan

## 2018-01-13 LAB — GRAM STAIN: Organism ID, Bacteria: NONE SEEN

## 2018-01-17 ENCOUNTER — Encounter (HOSPITAL_COMMUNITY): Payer: Self-pay | Admitting: *Deleted

## 2018-01-17 ENCOUNTER — Inpatient Hospital Stay (HOSPITAL_COMMUNITY)
Admission: AD | Admit: 2018-01-17 | Discharge: 2018-01-17 | Disposition: A | Discharge status: Home / Self Care | Payer: BLUE CROSS/BLUE SHIELD | Source: Ambulatory Visit | Attending: Family Medicine | Admitting: Family Medicine

## 2018-01-17 DIAGNOSIS — Z882 Allergy status to sulfonamides status: Secondary | ICD-10-CM | POA: Insufficient documentation

## 2018-01-17 DIAGNOSIS — G8918 Other acute postprocedural pain: Secondary | ICD-10-CM | POA: Insufficient documentation

## 2018-01-17 DIAGNOSIS — Z7982 Long term (current) use of aspirin: Secondary | ICD-10-CM | POA: Diagnosis not present

## 2018-01-17 DIAGNOSIS — Z9889 Other specified postprocedural states: Secondary | ICD-10-CM | POA: Diagnosis not present

## 2018-01-17 DIAGNOSIS — Z79899 Other long term (current) drug therapy: Secondary | ICD-10-CM | POA: Diagnosis not present

## 2018-01-17 DIAGNOSIS — Z880 Allergy status to penicillin: Secondary | ICD-10-CM | POA: Insufficient documentation

## 2018-01-17 DIAGNOSIS — R509 Fever, unspecified: Secondary | ICD-10-CM | POA: Diagnosis not present

## 2018-01-17 DIAGNOSIS — L0390 Cellulitis, unspecified: Secondary | ICD-10-CM

## 2018-01-17 DIAGNOSIS — L7682 Other postprocedural complications of skin and subcutaneous tissue: Secondary | ICD-10-CM | POA: Diagnosis not present

## 2018-01-17 DIAGNOSIS — Z9851 Tubal ligation status: Secondary | ICD-10-CM | POA: Insufficient documentation

## 2018-01-17 LAB — CBC WITH DIFFERENTIAL/PLATELET
BASOS PCT: 0 %
Basophils Absolute: 0.1 10*3/uL (ref 0.0–0.1)
Eosinophils Absolute: 0.2 10*3/uL (ref 0.0–0.7)
Eosinophils Relative: 1 %
HEMATOCRIT: 40.8 % (ref 36.0–46.0)
HEMOGLOBIN: 13.5 g/dL (ref 12.0–15.0)
Lymphocytes Relative: 22 %
Lymphs Abs: 2.8 10*3/uL (ref 0.7–4.0)
MCH: 31 pg (ref 26.0–34.0)
MCHC: 33.1 g/dL (ref 30.0–36.0)
MCV: 93.8 fL (ref 78.0–100.0)
MONO ABS: 0.6 10*3/uL (ref 0.1–1.0)
MONOS PCT: 5 %
NEUTROS ABS: 9.3 10*3/uL — AB (ref 1.7–7.7)
Neutrophils Relative %: 72 %
Platelets: 398 10*3/uL (ref 150–400)
RBC: 4.35 MIL/uL (ref 3.87–5.11)
RDW: 13.5 % (ref 11.5–15.5)
WBC: 13 10*3/uL — ABNORMAL HIGH (ref 4.0–10.5)

## 2018-01-17 MED ORDER — DOXYCYCLINE HYCLATE 100 MG PO CAPS: 100.0000 mg | ORAL_CAPSULE | Freq: Two times a day (BID) | ORAL | 7 refills | Status: DC

## 2018-01-17 MED ORDER — OXYCODONE-ACETAMINOPHEN 5-325 MG PO TABS: 1.0000 | ORAL_TABLET | Freq: Four times a day (QID) | ORAL | 0 refills | Status: DC | PRN

## 2018-01-17 MED ORDER — CLINDAMYCIN HCL 150 MG PO CAPS: 300.0000 mg | ORAL_CAPSULE | Freq: Three times a day (TID) | ORAL | 7 refills | Status: DC

## 2018-01-17 NOTE — Discharge Instructions (Signed)

## 2018-01-17 NOTE — MAU Note (Signed)
Pt had c-section 12/30/2017. Has had several seromas that had to be drained. Having increase pain and hardness on the right side of incision. Also has a red bumpy rash on her abd that started 1.5 weeks ago. Has had a low grade fever of 99 but today at home it was 100.5.

## 2018-01-17 NOTE — MAU Provider Note (Signed)
History     CSN: [id number]  Arrival date and time: 01/17/18 1525   None     Chief Complaint  Patient presents with  . Wound Check   HPI Emily Caterlicia L Love is 42 y.o. [phone] presents with right  Incisional  pain and fever of 100.5 today.  Feels there is a knot under the skin.  Rates pain 8/10 more pain on exam. She had repeat C-Section on 12/30/2017.  Was seen in ED for  Bleeding from the open incision.  States only 1 small area was open and had  Dx Wound Dihiscence on 01/07/2018. R/U with Dr. Emelda FearFerguson.  Area drained  on that visit  Had rash that was thought to be related to Septra and antibiotic changed to Cipro.  She is using hydrocortisone. Took Cipro and Ibuprofen 600mg  today.   Past Medical History:  Diagnosis Date  . Abnormal uterine bleeding (AUB) 01/26/2015  . Arthritis    psoriatic  . Complication of anesthesia   . Fatigue 01/26/2015  . Gestational diabetes   . History of PCOS 01/26/2015  . LLQ pain 01/26/2015  . Obesity   . PCOS (polycystic ovarian syndrome)   . Psoriasis   . Spinal headache    both deliveries    Past Surgical History:  Procedure Laterality Date  . CARPAL TUNNEL RELEASE Right 2000  . CESAREAN SECTION  [id number]  . CESAREAN SECTION WITH BILATERAL TUBAL LIGATION Bilateral 12/30/2017   Procedure: REPEAT CESAREAN SECTION WITH BILATERAL TUBAL LIGATION;  Surgeon: Tilda BurrowFerguson, John V, MD;  [facility];  Service: Obstetrics;  Laterality: Bilateral;  . endometrial biopsy    . LAPAROSCOPY FOR ECTOPIC PREGNANCY  1997  . lumph node removed  2007  . TONSILLECTOMY      Family History  Problem Relation Age of Onset  . Hypothyroidism Mother   . Diabetes Father   . Heart disease Father   . Other Father        "something wrong with bones"  . Other Maternal Grandfather        brain tumor  . Cancer Paternal Grandmother        liver  . Epilepsy Son   . Narcolepsy Son     Social History   Tobacco Use  . Smoking status: Never Smoker  .  Smokeless tobacco: Never Used  Substance Use Topics  . Alcohol use: No  . Drug use: No    Allergies:  Allergies  Allergen Reactions  . Penicillins Anaphylaxis    Can't breath Has patient had a PCN reaction causing immediate rash, facial/tongue/throat swelling, SOB or lightheadedness with hypotension: Yes Has patient had a PCN reaction causing severe rash involving mucus membranes or skin necrosis: No Has patient had a PCN reaction that required hospitalization: No Has patient had a PCN reaction occurring within the last 10 years: No If all of the above answers are "NO", then may proceed with Cephalosporin use.   . Humira [Adalimumab] Hives  . Septra [Sulfamethoxazole-Trimethoprim] Itching    Medications Prior to Admission  Medication Sig Dispense Refill Last Dose  . albuterol (PROVENTIL HFA;VENTOLIN HFA) 108 (90 Base) MCG/ACT inhaler Inhale 2 puffs into the lungs every 6 (six) hours as needed for wheezing or shortness of breath. (Patient not taking: Reported on 01/07/2018) 1 Inhaler 2 Not Taking  . aspirin EC 81 MG tablet Take 81 mg by mouth daily.   Not Taking  . ciprofloxacin (CIPRO) 500 MG tablet Take 1 tablet (500 mg total) by mouth  2 (two) times daily. 14 tablet 0   . docusate sodium (COLACE) 100 MG capsule Take 1 capsule (100 mg total) by mouth 2 (two) times daily as needed for mild constipation or moderate constipation. (Patient not taking: Reported on 01/09/2018) 30 capsule 2 Not Taking  . folic acid (FOLVITE) 400 MCG tablet Take 400 mcg by mouth daily.   Taking  . ibuprofen (ADVIL,MOTRIN) 600 MG tablet Take 1 tablet (600 mg total) by mouth every 6 (six) hours. (Patient not taking: Reported on 01/09/2018) 30 tablet 0 Not Taking  . Multiple Vitamins-Minerals (CENTRUM ADULTS PO) Take 1 tablet by mouth daily.    Taking  . nystatin (MYCOSTATIN) 100000 UNIT/ML suspension Take 5 mLs (500,000 Units total) by mouth 4 (four) times daily. Swish and hold as long as possible, then swallow  (Patient not taking: Reported on 12/30/2017) 60 mL 0 Not Taking  . nystatin cream (MYCOSTATIN) Apply 1 application topically 2 (two) times daily. 30 g 1 Taking  . oxyCODONE-acetaminophen (PERCOCET/ROXICET) 5-325 MG tablet Take 1 tablet by mouth every 4 (four) hours as needed for severe pain. (Patient not taking: Reported on 01/07/2018) 30 tablet 0 Not Taking  . sulfamethoxazole-trimethoprim (BACTRIM) 400-80 MG tablet Take 1 tablet by mouth 2 (two) times daily. Twice daily for uti (Patient not taking: Reported on 01/09/2018) 14 tablet 0 Not Taking    Review of Systems  Constitutional: Positive for fever. Negative for chills.  Gastrointestinal: Positive for abdominal pain (incisionl/wound pain.  Diffuse red rash on her abodmen. Reports itching. ).  Genitourinary: Positive for vaginal bleeding (continues with post delivery bleeding). Negative for dysuria and frequency.  Skin: Positive for rash (diffuse red pruitic rash on abdomen).   Physical Exam   Blood pressure (!) 113/47, pulse (!) 118, temperature 98.9 F (37.2 C), resp. rate 18, height 5\' 2"  (1.575 m), weight 214 lb (97.1 kg), currently breastfeeding.  Physical Exam  Nursing note and vitals reviewed. Constitutional: She is oriented to person, place, and time. She appears well-developed and well-nourished. No distress.  HENT:  Head: Normocephalic.  Neck: Normal range of motion.  Respiratory: Effort normal.  GI: She exhibits no distension and no mass. Tenderness: over right edge of the incision which is closed and not draining.  Sliightly red and there is a quarter size very firm, tender area above the right edge of incision.d. There is no rebound and no guarding.  Neurological: She is alert and oriented to person, place, and time.  Skin: Skin is warm and dry. Rash (diffuse red, slightly raised rash over abdomen and upper thighs.) noted.  Psychiatric: She has a normal mood and affect. Her behavior is normal. Thought content normal.    Results for orders placed or performed during the hospital encounter of 01/17/18 (from the past 24 hour(s))  CBC with Differential/Platelet     Status: Abnormal   Collection Time: 01/17/18  4:17 PM  Result Value Ref Range   WBC 13.0 (H) 4.0 - 10.5 K/uL   RBC 4.35 3.87 - 5.11 MIL/uL   Hemoglobin 13.5 12.0 - 15.0 g/dL   HCT 16.1 09.6 - 04.5 %   MCV 93.8 78.0 - 100.0 fL   MCH 31.0 26.0 - 34.0 pg   MCHC 33.1 30.0 - 36.0 g/dL   RDW 40.9 81.1 - 91.4 %   Platelets 398 150 - 400 K/uL   Neutrophils Relative % 72 %   Neutro Abs 9.3 (H) 1.7 - 7.7 K/uL   Lymphocytes Relative 22 %   Lymphs  Abs 2.8 0.7 - 4.0 K/uL   Monocytes Relative 5 %   Monocytes Absolute 0.6 0.1 - 1.0 K/uL   Eosinophils Relative 1 %   Eosinophils Absolute 0.2 0.0 - 0.7 K/uL   Basophils Relative 0 %   Basophils Absolute 0.1 0.0 - 0.1 K/uL   MAU Course  Procedures  MDM MSE Exam Labs Called Dr. Shawnie Pons, she is coming to the unit.  Dr. Shawnie Pons in to see patient.  Order given to change antibiotic to Clindamycin 300mg  po tid and Doxycycline 100mg  bid X 7 days.  Heat to affected area.  Keep appt with Dr. Emelda Fear for Monday.    Assessment and Plan  A:  Incisional pain      C-section delivery 12/30/2017 P  Rxs for Doxycline and Clindamycin sent to pharmacy      Instructed to use heat to area and to D/C use of Cipro.      Rx for Pain med to pharmacy   Dennison Mascot  01/17/2018, 3:59 PM

## 2018-01-19 ENCOUNTER — Encounter: Payer: Self-pay | Admitting: Obstetrics and Gynecology

## 2018-01-19 ENCOUNTER — Ambulatory Visit (INDEPENDENT_AMBULATORY_CARE_PROVIDER_SITE_OTHER): Payer: BLUE CROSS/BLUE SHIELD | Admitting: Obstetrics and Gynecology

## 2018-01-19 VITALS — BP 110/80 | HR 95 | Wt 212.2 lb

## 2018-01-19 DIAGNOSIS — O9089 Other complications of the puerperium, not elsewhere classified: Secondary | ICD-10-CM

## 2018-01-19 MED ORDER — OXYCODONE-ACETAMINOPHEN 5-325 MG PO TABS: 1.0000 | ORAL_TABLET | ORAL | 0 refills | Status: DC | PRN

## 2018-01-19 NOTE — Progress Notes (Signed)
Patient ID: Emily Love, female   DOB: 1976/02/06, 42 y.o.   MRN: [id number]    Subjective:  Emily Love is a 42 y.o. female now 3 weeks status post REPEAT CESAREAN SECTION WITH BILATERAL TUBAL LIGATION.   She continues to experience right sided abdominal pain and pressure. She was seen on 01/12/2018 and had the pocket of fluid to her right abdomen drained. The patient reports feeling relief that only lasted a few days before the discomfort returned. She was seen in the ED on 01/17/2018 for similar symptoms and a rash that was longstanding and was switched in her antibiotics to doxycycline and Cleocin..  Review of Systems Negative except abdominal pain, pressure   Diet:   normal   Bowel movements : normal.  Pain is controlled with current analgesics. Medications being used: narcotic analgesics including Percocet.  Objective:  BP 110/80 (BP Location: Right Arm, Patient Position: Sitting, Cuff Size: Small)   Pulse 95   Wt 212 lb 3.2 oz (96.3 kg)   Breastfeeding? No   BMI 38.81 kg/m   General:Well developed, well nourished.  No acute distress. Abdomen: Bowel sounds normal, soft, non-tender. Incision(s):   Two fluid collections, no erythema, no hernia, no swelling, no dehiscence, Pelvic Exam: DEFERRED   PROCEDURE: Verbal consent was obtained after explaining to the patient what the plan was.  Ultrasound was performed to identify persistent loculation which was a thin narrow strip superior to the old incision line which was intact and appeared without erythema the patient was then prepped with betadine and numbed with 1% lidocaine;  5cc of fluid was removed. Initially clear fluid, followed by thicker fluid consistent suggesting loculation.    There was no malodor, and did not appear to be purulent last week's aspiration had cultured no bacteria and Gram stain was negative  Assessment:  1. Post-Op 3 weeks s/p REPEAT CESAREAN SECTION WITH BILATERAL TUBAL LIGATION  2.  Postoperative  complication of wound seroma, culture negative so far   Plan:  1.Wound care discussed  2. Rx Percocet for pain 3. Activity restrictions: no lifting more than 15 pounds 4. return to work: not applicable. 5. Follow up in 10 weeks.  By signing my name below, I, Diona BrownerJennifer Gorman, attest that this documentation has been prepared under the direction and in the presence of Tilda Burrow,  V, MD. Electronically Signed: Diona BrownerJennifer Gorman, Medical Scribe. 01/19/18. 12:04 PM.  I personally performed the services described in this documentation, which was SCRIBED in my presence. The recorded information has been reviewed and considered accurate. It has been edited as necessary during review. Tilda Burrow V , MD

## 2018-01-21 LAB — WOUND CULTURE: ORGANISM ID, BACTERIA: NONE SEEN

## 2018-01-28 ENCOUNTER — Ambulatory Visit (INDEPENDENT_AMBULATORY_CARE_PROVIDER_SITE_OTHER): Payer: BLUE CROSS/BLUE SHIELD | Admitting: Obstetrics and Gynecology

## 2018-01-28 ENCOUNTER — Encounter: Payer: Self-pay | Admitting: Obstetrics and Gynecology

## 2018-01-28 ENCOUNTER — Other Ambulatory Visit: Payer: Self-pay

## 2018-01-28 VITALS — BP 120/86 | HR 84 | Ht 62.0 in | Wt 206.4 lb

## 2018-01-28 DIAGNOSIS — O8600 Infection of obstetric surgical wound, unspecified: Secondary | ICD-10-CM

## 2018-01-28 DIAGNOSIS — Z9889 Other specified postprocedural states: Secondary | ICD-10-CM

## 2018-01-28 MED ORDER — DOXYCYCLINE HYCLATE 100 MG PO CAPS: 100.0000 mg | ORAL_CAPSULE | Freq: Two times a day (BID) | ORAL | 1 refills | Status: DC

## 2018-01-28 MED ORDER — TRAMADOL HCL 50 MG PO TABS: 50.0000 mg | ORAL_TABLET | Freq: Four times a day (QID) | ORAL | 0 refills | Status: DC | PRN

## 2018-01-28 NOTE — Progress Notes (Signed)
Patient ID: Emily Love, female   DOB: 30-May-1976, 42 y.o.   MRN: [id number]    Subjective:  Emily Love is a 42 y.o. female now 4 weeks status post REPEAT CESAREAN SECTION WITH BILATERAL TUBAL LIGATION.  Postop recovery has been complicated by MRSA infection of the incision.  Wound culture was slow to grow out the organism   She is here for a wound check of her repeat c-section that was done on 12/30/2017. She reports that she is starting to feel better. However, it was discovered that she has MRSA. She still experiences soreness. She has stopped bottle feeding. She denies fever, chills or any other symptoms or complaints at this time.   Review of Systems Negative except mild sorenes   Diet:   normal   Bowel movements : normal.  She is experiencing some discomfort. She will take the prescribed percocet if ibuprofen does not work for her.   Objective:  There were no vitals taken for this visit. General:Well developed, well nourished.  No acute distress. Abdomen: Bowel sounds normal, soft, non-tender. Pelvic Exam:    External Genitalia:  Normal.    Vagina: Normal    Cervix: Normal    Uterus: Normal    Adnexa/Bimanual: Normal  Incision(s):   Improving firmness and resolved erythema on right half of incision, fibrosis present     Assessment:  1. Post-Op 4 weeks s/p REPEAT CESAREAN SECTION WITH BILATERAL TUBAL LIGATION  2. Doing better postoperatively. 3. MRSA 4.    Plan:  1.Wound care discussed   2. Refill Rx doxycycline, Rx tramadol 3. Activity restrictions: no lifting more than 15 pounds 4. return to work: not applicable. 5. Follow up in 2 weeks. 6 tramadol for mild pain  By signing my name below, I, Diona BrownerJennifer Gorman, attest that this documentation has been prepared under the direction and in the presence of Tilda Burrow,  V, MD. Electronically Signed: Diona BrownerJennifer Gorman, Medical Scribe. 01/28/18. 1:25 PM.  I personally performed the services described in this  documentation, which was SCRIBED in my presence. The recorded information has been reviewed and considered accurate. It has been edited as necessary during review. Tilda Burrow V , MD

## 2018-02-06 ENCOUNTER — Ambulatory Visit: Payer: BLUE CROSS/BLUE SHIELD | Admitting: Obstetrics and Gynecology

## 2018-02-10 ENCOUNTER — Encounter: Payer: Self-pay | Admitting: Obstetrics and Gynecology

## 2018-02-10 ENCOUNTER — Ambulatory Visit (INDEPENDENT_AMBULATORY_CARE_PROVIDER_SITE_OTHER): Payer: BLUE CROSS/BLUE SHIELD | Admitting: Obstetrics and Gynecology

## 2018-02-10 VITALS — BP 120/90 | HR 90 | Wt 210.0 lb

## 2018-02-10 DIAGNOSIS — O909 Complication of the puerperium, unspecified: Secondary | ICD-10-CM

## 2018-02-10 DIAGNOSIS — IMO0001 Reserved for inherently not codable concepts without codable children: Secondary | ICD-10-CM

## 2018-02-10 MED ORDER — DOXYCYCLINE HYCLATE 100 MG PO CAPS: 100.0000 mg | ORAL_CAPSULE | Freq: Two times a day (BID) | ORAL | 1 refills | Status: DC

## 2018-02-10 NOTE — Addendum Note (Signed)
Addended by: Tilda BurrowFERGUSON,  V on: 02/10/2018 11:23 AM   Modules accepted: Orders

## 2018-02-10 NOTE — Progress Notes (Signed)
   Subjective:  Emily Love is a 42 y.o. female now 6 weeks status post Cesarean section and tubal ligation with recovery complicated by MRSA infection of the incision.   Patient gradually improving she has not only a tiny bit of discomfort in the midline above the incision.  The redness is gone Review of Systems Negative except still has a little bit of white discharge in the area of the incision there is a small pinhole opening that may be granulation tissue or a sinus to deeper into the incision there is lots of scarring and retraction at the old incision line   Diet:   Regular   Bowel movements : normal.  The patient is not having any pain.  Objective:  BP 120/90 (BP Location: Left Arm, Patient Position: Sitting, Cuff Size: Large)   Pulse 90   Wt 210 lb (95.3 kg)   Breastfeeding? No   BMI 38.41 kg/m  General:Well developed, well nourished.  No acute distress. Abdomen: Bowel sounds normal, soft, non-tender.  Incision as described above with tiny 1 mm pinhole in the midline.  Unable to express any purulence or blood or fluid or odor from the incision Pelvic Exam:    External Genitalia:  Normal.     Incision(s):   Healing slowly and steadily, no drainage, no erythema, no hernia, no swelling, no dehiscence,     Assessment:  Post-Op 6 weeks s/p Cesarean section with tubal ligation  MRSA wound infection, improving We will continue antibiotics x2 more weeks postoperatively.   Plan:  1.Wound care discussed   2. . current medications.  Doxycycline 100 mg twice daily to be continued 3. Activity restrictions: none and Full duty 4. return to work: 2-3 weeks. 5. Follow up in 2 weeks.

## 2018-02-16 ENCOUNTER — Other Ambulatory Visit: Payer: Self-pay | Admitting: *Deleted

## 2018-02-16 ENCOUNTER — Telehealth: Payer: Self-pay | Admitting: Obstetrics and Gynecology

## 2018-02-16 MED ORDER — DOXYCYCLINE HYCLATE 100 MG PO CAPS: 100.0000 mg | ORAL_CAPSULE | Freq: Two times a day (BID) | ORAL | 1 refills | Status: DC

## 2018-02-16 NOTE — Telephone Encounter (Signed)
Patient informed patient was printed and not sent electronically.   Will resend electronically.

## 2018-03-04 ENCOUNTER — Encounter: Payer: Self-pay | Admitting: Obstetrics and Gynecology

## 2018-03-04 ENCOUNTER — Ambulatory Visit (INDEPENDENT_AMBULATORY_CARE_PROVIDER_SITE_OTHER): Payer: BLUE CROSS/BLUE SHIELD | Admitting: Obstetrics and Gynecology

## 2018-03-04 VITALS — BP 128/78 | HR 85 | Ht 62.0 in | Wt 216.0 lb

## 2018-03-04 DIAGNOSIS — B9562 Methicillin resistant Staphylococcus aureus infection as the cause of diseases classified elsewhere: Secondary | ICD-10-CM | POA: Diagnosis not present

## 2018-03-04 DIAGNOSIS — T8149XA Infection following a procedure, other surgical site, initial encounter: Secondary | ICD-10-CM

## 2018-03-04 NOTE — Progress Notes (Signed)
   Subjective:  Emily Love is a 42 y.o. female now 9 weeks status post repeat Cesarean section and tubal ligation with recovery complicated by MRSA infection of the incision. She had been taking Doxycycline 100mg  twice daily for two weeks after her last visit on 02/10/18. She finished it four days ago, and says she has gained weight and feels full of fluid. She is not breastfeeding.  Review of Systems Negative except pt reports her incision still has an opening that is draining a pinkish fluid. She has also noted some vaginal drainage that is the same color as her incision drainage.   Diet:   normal   Bowel movements : normal.  The patient is not having any pain.  Objective:  BP 128/78 (BP Location: Right Arm, Patient Position: Sitting, Cuff Size: Large)   Pulse 85   Ht 5\' 2"  (1.575 m)   Wt 216 lb (98 kg)   Breastfeeding? No   BMI 39.51 kg/m  General:Well developed, well nourished.  No acute distress. Abdomen: Bowel sounds normal, soft, non-tender. Pelvic Exam: VULVA: normal appearing vulva with no masses, tenderness or lesions,  VAGINA: Normal healthy looking tissue    Incision(s):  deep crease where she has healed, no opening found,  however pt notes pinkish discharge has been leaking   no erythema, no hernia, no swelling, no dehiscence,    Assessment:  1. Post-Op 9 weeks s/p repeat C/S and BTL  2. Wound scarring, s/p MRSA,  3. Hopefully resolved MRSA infection  Doing well postoperatively. Incision appears adequately healed with deep crease with two small pinholes that appear healed, but pt reports light pink drainage last week   Plan:  1.Wound care discussed  2. current medications: none 3. Activity restrictions: none 4. return to work: now. 5. Follow up PRN, or by 6 months if chronic drainage continues   By signing my name below, I, Izna Ahmed, attest that this documentation has been prepared under the direction and in the presence of Tilda Burrow,  V,  MD. Electronically Signed: Redge GainerIzna Ahmed, Medical Scribe. 03/04/18. 11:00 AM.  I personally performed the services described in this documentation, which was SCRIBED in my presence. The recorded information has been reviewed and considered accurate. It has been edited as necessary during review. Tilda Burrow V , MD

## 2018-05-28 DIAGNOSIS — Z79899 Other long term (current) drug therapy: Secondary | ICD-10-CM | POA: Diagnosis not present

## 2018-05-28 DIAGNOSIS — M533 Sacrococcygeal disorders, not elsewhere classified: Secondary | ICD-10-CM | POA: Diagnosis not present

## 2018-05-28 DIAGNOSIS — M549 Dorsalgia, unspecified: Secondary | ICD-10-CM | POA: Diagnosis not present

## 2018-05-28 DIAGNOSIS — L4050 Arthropathic psoriasis, unspecified: Secondary | ICD-10-CM | POA: Diagnosis not present

## 2018-09-17 DIAGNOSIS — M549 Dorsalgia, unspecified: Secondary | ICD-10-CM | POA: Diagnosis not present

## 2018-09-17 DIAGNOSIS — L4050 Arthropathic psoriasis, unspecified: Secondary | ICD-10-CM | POA: Diagnosis not present

## 2018-09-17 DIAGNOSIS — M533 Sacrococcygeal disorders, not elsewhere classified: Secondary | ICD-10-CM | POA: Diagnosis not present

## 2018-09-17 DIAGNOSIS — Z79899 Other long term (current) drug therapy: Secondary | ICD-10-CM | POA: Diagnosis not present

## 2018-12-16 DIAGNOSIS — M19041 Primary osteoarthritis, right hand: Secondary | ICD-10-CM | POA: Diagnosis not present

## 2018-12-16 DIAGNOSIS — Z79899 Other long term (current) drug therapy: Secondary | ICD-10-CM | POA: Diagnosis not present

## 2018-12-16 DIAGNOSIS — L4050 Arthropathic psoriasis, unspecified: Secondary | ICD-10-CM | POA: Diagnosis not present

## 2018-12-16 DIAGNOSIS — M533 Sacrococcygeal disorders, not elsewhere classified: Secondary | ICD-10-CM | POA: Diagnosis not present

## 2018-12-16 DIAGNOSIS — M79641 Pain in right hand: Secondary | ICD-10-CM | POA: Diagnosis not present

## 2018-12-16 DIAGNOSIS — M19042 Primary osteoarthritis, left hand: Secondary | ICD-10-CM | POA: Diagnosis not present

## 2018-12-16 DIAGNOSIS — M549 Dorsalgia, unspecified: Secondary | ICD-10-CM | POA: Diagnosis not present

## 2018-12-16 DIAGNOSIS — M79642 Pain in left hand: Secondary | ICD-10-CM | POA: Diagnosis not present

## 2019-04-21 DIAGNOSIS — L4050 Arthropathic psoriasis, unspecified: Secondary | ICD-10-CM | POA: Diagnosis not present

## 2019-04-21 DIAGNOSIS — M549 Dorsalgia, unspecified: Secondary | ICD-10-CM | POA: Diagnosis not present

## 2019-04-21 DIAGNOSIS — Z79899 Other long term (current) drug therapy: Secondary | ICD-10-CM | POA: Diagnosis not present

## 2019-04-21 DIAGNOSIS — M533 Sacrococcygeal disorders, not elsewhere classified: Secondary | ICD-10-CM | POA: Diagnosis not present

## 2019-07-06 DIAGNOSIS — H00011 Hordeolum externum right upper eyelid: Secondary | ICD-10-CM | POA: Diagnosis not present

## 2019-07-13 ENCOUNTER — Ambulatory Visit: Payer: Self-pay | Admitting: *Deleted

## 2019-07-13 NOTE — Telephone Encounter (Signed)
Patient is out of town- she has stopped the pain medication. Patient is vomiting- can not eat. Husband is concerned about her- she does not have ID and the hospital out of town will not treat her without it- unless emergency. Advised husband gingerale, small bites- cracker/toast, and call the prescribing provider back- they can possibly give her medication for nausea and set an appointment up tomorrow when they return home. Also advised to check with pharmacy- they may have something OTC to help settle her stomach. He will do all the above.  Reason for Disposition . [1] Caller has URGENT medication question about med that PCP or specialist prescribed AND [2] triager unable to answer question  Answer Assessment - Initial Assessment Questions 1.   NAME of MEDICATION: "What medicine are you calling about?"     Hydrocodone- 2-3/day 2.   QUESTION: "What is your question?"     Wife is having withdrawal form pain medication 3.   PRESCRIBING HCP: "Who prescribed it?" Reason: if prescribed by specialist, call should be referred to that group.     Dr in [place] 4. SYMPTOMS: "Do you have any symptoms?"     vomiting 5. SEVERITY: If symptoms are present, ask "Are they mild, moderate or severe?"     moderate 6.  PREGNANCY:  "Is there any chance that you are pregnant?" "When was your last menstrual period?"     n/a  Protocols used: MEDICATION QUESTION CALL-A-AH

## 2019-07-22 DIAGNOSIS — Z23 Encounter for immunization: Secondary | ICD-10-CM | POA: Diagnosis not present

## 2019-07-22 DIAGNOSIS — M533 Sacrococcygeal disorders, not elsewhere classified: Secondary | ICD-10-CM | POA: Diagnosis not present

## 2019-07-22 DIAGNOSIS — Z79899 Other long term (current) drug therapy: Secondary | ICD-10-CM | POA: Diagnosis not present

## 2019-07-22 DIAGNOSIS — M25562 Pain in left knee: Secondary | ICD-10-CM | POA: Diagnosis not present

## 2019-07-22 DIAGNOSIS — M25561 Pain in right knee: Secondary | ICD-10-CM | POA: Diagnosis not present

## 2019-07-22 DIAGNOSIS — M549 Dorsalgia, unspecified: Secondary | ICD-10-CM | POA: Diagnosis not present

## 2019-07-22 DIAGNOSIS — L4050 Arthropathic psoriasis, unspecified: Secondary | ICD-10-CM | POA: Diagnosis not present

## 2020-03-29 DIAGNOSIS — L4050 Arthropathic psoriasis, unspecified: Secondary | ICD-10-CM | POA: Diagnosis not present

## 2020-03-29 DIAGNOSIS — Z79899 Other long term (current) drug therapy: Secondary | ICD-10-CM | POA: Diagnosis not present

## 2020-03-29 DIAGNOSIS — M533 Sacrococcygeal disorders, not elsewhere classified: Secondary | ICD-10-CM | POA: Diagnosis not present

## 2020-03-29 DIAGNOSIS — M549 Dorsalgia, unspecified: Secondary | ICD-10-CM | POA: Diagnosis not present

## 2020-05-01 DIAGNOSIS — L4050 Arthropathic psoriasis, unspecified: Secondary | ICD-10-CM | POA: Diagnosis not present

## 2020-05-01 DIAGNOSIS — L409 Psoriasis, unspecified: Secondary | ICD-10-CM | POA: Diagnosis not present

## 2020-05-29 DIAGNOSIS — L4050 Arthropathic psoriasis, unspecified: Secondary | ICD-10-CM | POA: Diagnosis not present

## 2020-05-29 DIAGNOSIS — L409 Psoriasis, unspecified: Secondary | ICD-10-CM | POA: Diagnosis not present

## 2020-07-11 DIAGNOSIS — Z79899 Other long term (current) drug therapy: Secondary | ICD-10-CM | POA: Diagnosis not present

## 2020-07-11 DIAGNOSIS — M549 Dorsalgia, unspecified: Secondary | ICD-10-CM | POA: Diagnosis not present

## 2020-07-11 DIAGNOSIS — L409 Psoriasis, unspecified: Secondary | ICD-10-CM | POA: Diagnosis not present

## 2020-07-11 DIAGNOSIS — L4050 Arthropathic psoriasis, unspecified: Secondary | ICD-10-CM | POA: Diagnosis not present

## 2020-07-24 ENCOUNTER — Other Ambulatory Visit: Payer: Self-pay

## 2020-07-24 ENCOUNTER — Ambulatory Visit
Admission: RE | Admit: 2020-07-24 | Discharge: 2020-07-24 | Disposition: A | Discharge status: Home / Self Care | Payer: Self-pay | Source: Ambulatory Visit | Attending: Emergency Medicine | Admitting: Emergency Medicine

## 2020-07-24 VITALS — BP 121/72 | HR 100 | Temp 99.2°F | Resp 16 | Ht 62.0 in | Wt 220.0 lb

## 2020-07-24 DIAGNOSIS — H9203 Otalgia, bilateral: Secondary | ICD-10-CM

## 2020-07-24 DIAGNOSIS — H6123 Impacted cerumen, bilateral: Secondary | ICD-10-CM

## 2020-07-24 MED ORDER — OFLOXACIN 0.3 % OT SOLN: 5.0000 [drp] | Freq: Every day | OTIC | 0 refills | Status: AC

## 2020-07-24 NOTE — Discharge Instructions (Addendum)
Rest and drink plenty of fluids Ear lavage performed Ofloxacin ear drops prescribed.   Use OTC ibuprofen and/ or tylenol as needed for pain control Follow up with PCP if symptoms persists Return here or go to the ER if you have any new or worsening symptoms fever, chills, nausea, vomiting, redness, swelling, ear pain, etc..Marland Kitchen

## 2020-07-24 NOTE — ED Triage Notes (Signed)
Pain and ear fullness since Friday

## 2020-07-24 NOTE — ED Provider Notes (Signed)
[facility]ENTER   [id number] 07/24/20 Arrival Time: 1928  CC:EAR PAIN  SUBJECTIVE: History from: patient.  Emily Love is a 44 y.o. female who presents with of bilateral ear pain, and decreased hearing x 3 days ago.  Denies a precipitating event, such as swimming or wearing ear plugs.  Patient states the pain is intermittent and achy in character.  Patient has tried left over ear drops without relief.  Denies aggravating factors.  Reports similar symptoms in the past that improved with ear drops.    Denies fever, chills, fatigue, sinus pain, rhinorrhea, ear discharge, sore throat, SOB, wheezing, chest pain, nausea, changes in bowel or bladder habits.    ROS: As per HPI.  All other pertinent ROS negative.     Past Medical History:  Diagnosis Date  . Abnormal uterine bleeding (AUB) 01/26/2015  . Arthritis    psoriatic  . Complication of anesthesia   . Fatigue 01/26/2015  . Gestational diabetes   . History of PCOS 01/26/2015  . LLQ pain 01/26/2015  . Obesity   . PCOS (polycystic ovarian syndrome)   . Psoriasis   . Spinal headache    both deliveries   Past Surgical History:  Procedure Laterality Date  . CARPAL TUNNEL RELEASE Right 2000  . CESAREAN SECTION  [id number]  . CESAREAN SECTION WITH BILATERAL TUBAL LIGATION Bilateral 12/30/2017   Procedure: REPEAT CESAREAN SECTION WITH BILATERAL TUBAL LIGATION;  Surgeon: Tilda Burrow, MD;  Location: [facility] BIRTHING SUITES;  Service: Obstetrics;  Laterality: Bilateral;  . endometrial biopsy    . LAPAROSCOPY FOR ECTOPIC PREGNANCY  1997  . lumph node removed  2007  . TONSILLECTOMY     Allergies  Allergen Reactions  . Penicillins Anaphylaxis    Can't breath Has patient had a PCN reaction causing immediate rash, facial/tongue/throat swelling, SOB or lightheadedness with hypotension: Yes Has patient had a PCN reaction causing severe rash involving mucus membranes or skin necrosis: No Has patient had a PCN reaction that required  hospitalization: No Has patient had a PCN reaction occurring within the last 10 years: No If all of the above answers are "NO", then may proceed with Cephalosporin use.   . Humira [Adalimumab] Hives  . Septra [Sulfamethoxazole-Trimethoprim] Itching   No current facility-administered medications on file prior to encounter.   Current Outpatient Medications on File Prior to Encounter  Medication Sig Dispense Refill  . Multiple Vitamins-Minerals (CENTRUM ADULTS PO) Take 1 tablet by mouth daily.      Social History   Socioeconomic History  . Marital status: Married    Spouse name: Not on file  . Number of children: Not on file  . Years of education: Not on file  . Highest education level: Not on file  Occupational History  . Not on file  Tobacco Use  . Smoking status: Never Smoker  . Smokeless tobacco: Never Used  Substance and Sexual Activity  . Alcohol use: No  . Drug use: No  . Sexual activity: Yes    Birth control/protection: Surgical    Comment: tubal  Other Topics Concern  . Not on file  Social History Narrative  . Not on file   Social Determinants of Health   Financial Resource Strain:   . Difficulty of Paying Living Expenses: Not on file  Food Insecurity:   . Worried About Programme researcher, broadcasting/film/video in the Last Year: Not on file  . Ran Out of Food in the Last Year: Not on file  Transportation Needs:   .  Lack of Transportation (Medical): Not on file  . Lack of Transportation (Non-Medical): Not on file  Physical Activity:   . Days of Exercise per Week: Not on file  . Minutes of Exercise per Session: Not on file  Stress:   . Feeling of Stress : Not on file  Social Connections:   . Frequency of Communication with Friends and Family: Not on file  . Frequency of Social Gatherings with Friends and Family: Not on file  . Attends Religious Services: Not on file  . Active Member of Clubs or Organizations: Not on file  . Attends Banker Meetings: Not on file  .  Marital Status: Not on file  Intimate Partner Violence:   . Fear of Current or Ex-Partner: Not on file  . Emotionally Abused: Not on file  . Physically Abused: Not on file  . Sexually Abused: Not on file   Family History  Problem Relation Age of Onset  . Hypothyroidism Mother   . Diabetes Father   . Heart disease Father   . Other Father        "something wrong with bones"  . Other Maternal Grandfather        brain tumor  . Cancer Paternal Grandmother        liver  . Epilepsy Son   . Narcolepsy Son     OBJECTIVE:  Vitals:   07/24/20 1939 07/24/20 1943  BP: 121/72   Pulse: 100   Resp: 16   Temp: 99.2 F (37.3 C)   TempSrc: Oral   SpO2: 99%   Weight:  220 lb (99.8 kg)  Height:  5\' 2"  (1.575 m)     General appearance: alert; well-appearing, nontoxic; speaking in full sentences and tolerating own secretions HEENT: NCAT; Ears: EACs with cerumen; Eyes: PERRL.  EOM grossly intact.Nose: nares patent without rhinorrhea, Throat: oropharynx clear, tonsils non erythematous or enlarged, uvula midline  Neck: supple without LAD Lungs: unlabored respirations, symmetrical air entry; cough: absent; no respiratory distress; CTAB Heart: regular rate and rhythm.  Skin: warm and dry Psychological: alert and cooperative; normal mood and affect    PROCEDURE:  Consent granted.  Bilateral ear lavage performed by RN.  TM not visualized.  PT tolerated procedure well.     ASSESSMENT & PLAN:  1. Ear discomfort, bilateral   2. Bilateral impacted cerumen     Meds ordered this encounter  Medications  . ofloxacin (FLOXIN) 0.3 % OTIC solution    Sig: Place 5 drops into both ears daily for 7 days.    Dispense:  5 mL    Refill:  0    Order Specific Question:   Supervising Provider    Answer:   Eustace Moore   Rest and drink plenty of fluids Ear lavage performed Polytrim ear drops prescribed.   Use OTC ibuprofen and/ or tylenol as needed for pain control Follow up with  PCP if symptoms persists Return here or go to the ER if you have any new or worsening symptoms fever, chills, nausea, vomiting, redness, swelling, ear pain, etc...  Reviewed expectations re: course of current medical issues. Questions answered. Outlined signs and symptoms indicating need for more acute intervention. Patient verbalized understanding. After Visit Summary given.         [[id number]], PA-C 07/24/20 2018

## 2020-07-28 DIAGNOSIS — Z20828 Contact with and (suspected) exposure to other viral communicable diseases: Secondary | ICD-10-CM | POA: Diagnosis not present

## 2020-09-06 DIAGNOSIS — Z79899 Other long term (current) drug therapy: Secondary | ICD-10-CM | POA: Diagnosis not present

## 2020-09-06 DIAGNOSIS — L409 Psoriasis, unspecified: Secondary | ICD-10-CM | POA: Diagnosis not present

## 2020-09-06 DIAGNOSIS — L4050 Arthropathic psoriasis, unspecified: Secondary | ICD-10-CM | POA: Diagnosis not present

## 2020-12-20 DIAGNOSIS — L409 Psoriasis, unspecified: Secondary | ICD-10-CM | POA: Diagnosis not present

## 2020-12-20 DIAGNOSIS — L4050 Arthropathic psoriasis, unspecified: Secondary | ICD-10-CM | POA: Diagnosis not present

## 2021-01-23 DIAGNOSIS — L409 Psoriasis, unspecified: Secondary | ICD-10-CM | POA: Diagnosis not present

## 2021-01-23 DIAGNOSIS — L4050 Arthropathic psoriasis, unspecified: Secondary | ICD-10-CM | POA: Diagnosis not present

## 2021-01-23 DIAGNOSIS — M549 Dorsalgia, unspecified: Secondary | ICD-10-CM | POA: Diagnosis not present

## 2021-01-23 DIAGNOSIS — Z79899 Other long term (current) drug therapy: Secondary | ICD-10-CM | POA: Diagnosis not present

## 2021-03-14 DIAGNOSIS — L4050 Arthropathic psoriasis, unspecified: Secondary | ICD-10-CM | POA: Diagnosis not present

## 2021-03-14 DIAGNOSIS — L409 Psoriasis, unspecified: Secondary | ICD-10-CM | POA: Diagnosis not present

## 2021-03-24 ENCOUNTER — Other Ambulatory Visit: Payer: Self-pay

## 2021-03-24 ENCOUNTER — Ambulatory Visit
Admission: RE | Admit: 2021-03-24 | Discharge: 2021-03-24 | Disposition: A | Discharge status: Home / Self Care | Payer: BC Managed Care – PPO | Source: Ambulatory Visit | Attending: Family Medicine | Admitting: Family Medicine

## 2021-03-24 VITALS — BP 125/85 | HR 92 | Temp 97.6°F | Resp 16

## 2021-03-24 DIAGNOSIS — K047 Periapical abscess without sinus: Secondary | ICD-10-CM

## 2021-03-24 DIAGNOSIS — R220 Localized swelling, mass and lump, head: Secondary | ICD-10-CM

## 2021-03-24 DIAGNOSIS — K0889 Other specified disorders of teeth and supporting structures: Secondary | ICD-10-CM

## 2021-03-24 MED ORDER — CLINDAMYCIN HCL 150 MG PO CAPS: 150.0000 mg | ORAL_CAPSULE | Freq: Four times a day (QID) | ORAL | 0 refills | Status: DC

## 2021-03-24 MED ORDER — HYDROCODONE-ACETAMINOPHEN 5-325 MG PO TABS: 1.0000 | ORAL_TABLET | Freq: Four times a day (QID) | ORAL | 0 refills | Status: AC | PRN

## 2021-03-24 NOTE — Discharge Instructions (Addendum)
I have sent in clindamycin for you to take every 6 hours while awake (4 times a day).  I have sent in Norco for you to take for pain, one tablet every 6 hours as needed for moderate to severe pain  Follow-up with your dentist on Monday.

## 2021-03-24 NOTE — ED Provider Notes (Signed)
[facility]    CSN: [id number] Arrival date & time: 03/24/21  1155      History   Chief Complaint No chief complaint on file.   HPI Emily Love is a 45 y.o. female.   Reports left upper dental pain, redness, left-sided facial swelling.  Has been taking ibuprofen and Tylenol at home.  Reports previous dental abscess and other teeth.  Has called her dentist, they cannot see her right now.  Denies headache, fever, abdominal pain, nausea, vomiting, diarrhea, rash, other symptoms.  ROS per HPI  The history is provided by the patient.    Past Medical History:  Diagnosis Date  . Abnormal uterine bleeding (AUB) 01/26/2015  . Arthritis    psoriatic  . Complication of anesthesia   . Fatigue 01/26/2015  . Gestational diabetes   . History of PCOS 01/26/2015  . LLQ pain 01/26/2015  . Obesity   . PCOS (polycystic ovarian syndrome)   . Psoriasis   . Spinal headache    both deliveries    Patient Active Problem List   Diagnosis Date Noted  . Postoperative MRSA infection of lower abdominal wound 03/04/2018  . Intertrigo of pannus 01/12/2018  . Cesarean section wound seroma, postpartum 01/07/2018  . Gestational hypertension without significant proteinuria 12/30/2017  . Status post repeat low transverse cesarean section 12/30/2017  . Rubella non-immune status, antepartum 07/04/2017  . Supervision of high risk pregnancy, antepartum 07/03/2017  . AMA (advanced maternal age) multigravida 35+ 07/03/2017  . Previous cesarean section complicating pregnancy 07/03/2017  . Psoriasis & psoriatic arthritis 07/03/2017  . LLQ pain 01/26/2015  . Abnormal uterine bleeding (AUB) 01/26/2015  . History of PCOS 01/26/2015  . Fatigue 01/26/2015    Past Surgical History:  Procedure Laterality Date  . CARPAL TUNNEL RELEASE Right 2000  . CESAREAN SECTION  [id number]  . CESAREAN SECTION WITH BILATERAL TUBAL LIGATION Bilateral 12/30/2017   Procedure: REPEAT CESAREAN SECTION WITH  BILATERAL TUBAL LIGATION;  Surgeon: Tilda Burrow, MD;  Location: [facility] BIRTHING SUITES;  Service: Obstetrics;  Laterality: Bilateral;  . endometrial biopsy    . LAPAROSCOPY FOR ECTOPIC PREGNANCY  1997  . lumph node removed  2007  . TONSILLECTOMY      OB History    Gravida  5   Para  3   Term  3   Preterm      AB  2   Living  3     SAB  1   IAB      Ectopic  1   Multiple      Live Births  3            Home Medications    Prior to Admission medications   Medication Sig Start Date End Date Taking? Authorizing Provider  clindamycin (CLEOCIN) 150 MG capsule Take 1 capsule (150 mg total) by mouth every 6 (six) hours. 03/24/21  Yes Moshe Cipro, NP  HYDROcodone-acetaminophen (NORCO/VICODIN) 5-325 MG tablet Take 1 tablet by mouth every 6 (six) hours as needed for up to 3 days for moderate pain or severe pain. 03/24/21 03/27/21 Yes Moshe Cipro, NP  Multiple Vitamins-Minerals (CENTRUM ADULTS PO) Take 1 tablet by mouth daily.     [provider]    Family History Family History  Problem Relation Age of Onset  . Hypothyroidism Mother   . Diabetes Father   . Heart disease Father   . Other Father        "something wrong with bones"  .  Other Maternal Grandfather        brain tumor  . Cancer Paternal Grandmother        liver  . Epilepsy Son   . Narcolepsy Son     Social History Social History   Tobacco Use  . Smoking status: Never Smoker  . Smokeless tobacco: Never Used  Substance Use Topics  . Alcohol use: No  . Drug use: No     Allergies   Penicillins, Humira [adalimumab], and Septra [sulfamethoxazole-trimethoprim]   Review of Systems Review of Systems   Physical Exam Triage Vital Signs ED Triage Vitals [03/24/21 1224]  Enc Vitals Group     BP 125/85     Pulse Rate 92     Resp 16     Temp 97.6 F (36.4 C)     Temp Source Tympanic     SpO2 96 %     Weight      Height      Head Circumference      Peak Flow       Pain Score      Pain Loc      Pain Edu?      Excl. in GC?    No data found.  Updated Vital Signs BP 125/85 (BP Location: Right Arm)   Pulse 92   Temp 97.6 F (36.4 C) (Tympanic)   Resp 16   SpO2 96%       Physical Exam Vitals and nursing note reviewed.  Constitutional:      General: She is not in acute distress.    Appearance: She is well-developed. She is not ill-appearing.  HENT:     Head: Normocephalic and atraumatic.     Comments: Left facial swelling    Right Ear: Tympanic membrane, ear canal and external ear normal.     Left Ear: Tympanic membrane, ear canal and external ear normal.     Nose: Nose normal.     Mouth/Throat:     Mouth: Mucous membranes are moist.     Dentition: Abnormal dentition. Dental tenderness, gingival swelling and dental caries present.     Pharynx: Oropharynx is clear.   Eyes:     Extraocular Movements: Extraocular movements intact.     Conjunctiva/sclera: Conjunctivae normal.     Pupils: Pupils are equal, round, and reactive to light.  Cardiovascular:     Rate and Rhythm: Normal rate.  Pulmonary:     Effort: Pulmonary effort is normal.  Musculoskeletal:        General: Normal range of motion.     Cervical back: Normal range of motion and neck supple.  Skin:    General: Skin is warm and dry.     Capillary Refill: Capillary refill takes less than 2 seconds.  Neurological:     General: No focal deficit present.     Mental Status: She is alert and oriented to person, place, and time.  Psychiatric:        Mood and Affect: Mood normal.        Behavior: Behavior normal.        Thought Content: Thought content normal.      UC Treatments / Results  Labs (all labs ordered are listed, but only abnormal results are displayed) Labs Reviewed - No data to display  EKG   Radiology No results found.  Procedures Procedures (including critical care time)  Medications Ordered in UC Medications - No data to display  Initial  Impression / Assessment and Plan /  UC Course  I have reviewed the triage vital signs and the nursing notes.  Pertinent labs & imaging results that were available during my care of the patient were reviewed by me and considered in my medical decision making (see chart for details).    Dental pain Dental infection Left facial swelling  Prescribed clindamycin 4 times daily x7 days Prescribed Norco PDMP reviewed Take antibiotic as directed and to completion Follow-up with dentistry on Monday Follow-up with the ER for high fever, trouble swallowing, trouble breathing, other concerning symptoms  Final Clinical Impressions(s) / UC Diagnoses   Final diagnoses:  Pain, dental  Dental infection  Facial swelling     Discharge Instructions     I have sent in clindamycin for you to take every 6 hours while awake (4 times a day).  I have sent in Norco for you to take for pain, one tablet every 6 hours as needed for moderate to severe pain  Follow-up with your dentist on Monday.    ED Prescriptions    Medication Sig Dispense Auth. Provider   clindamycin (CLEOCIN) 150 MG capsule Take 1 capsule (150 mg total) by mouth every 6 (six) hours. 28 capsule Moshe Cipro, NP   HYDROcodone-acetaminophen (NORCO/VICODIN) 5-325 MG tablet Take 1 tablet by mouth every 6 (six) hours as needed for up to 3 days for moderate pain or severe pain. 10 tablet Moshe Cipro, NP     I have reviewed the PDMP during this encounter.   Moshe Cipro, NP 03/24/21 1243

## 2021-03-24 NOTE — ED Triage Notes (Signed)
Dental pain x 2 days

## 2021-07-18 DIAGNOSIS — M549 Dorsalgia, unspecified: Secondary | ICD-10-CM | POA: Diagnosis not present

## 2021-07-18 DIAGNOSIS — Z79899 Other long term (current) drug therapy: Secondary | ICD-10-CM | POA: Diagnosis not present

## 2021-07-18 DIAGNOSIS — L4050 Arthropathic psoriasis, unspecified: Secondary | ICD-10-CM | POA: Diagnosis not present

## 2021-07-18 DIAGNOSIS — L409 Psoriasis, unspecified: Secondary | ICD-10-CM | POA: Diagnosis not present

## 2021-07-19 DIAGNOSIS — L4050 Arthropathic psoriasis, unspecified: Secondary | ICD-10-CM | POA: Diagnosis not present

## 2021-07-19 DIAGNOSIS — Z79899 Other long term (current) drug therapy: Secondary | ICD-10-CM | POA: Diagnosis not present

## 2021-10-23 DIAGNOSIS — Z20822 Contact with and (suspected) exposure to covid-19: Secondary | ICD-10-CM | POA: Diagnosis not present

## 2021-10-23 DIAGNOSIS — J019 Acute sinusitis, unspecified: Secondary | ICD-10-CM | POA: Diagnosis not present

## 2021-11-08 ENCOUNTER — Encounter (HOSPITAL_COMMUNITY): Payer: Self-pay

## 2021-11-08 ENCOUNTER — Other Ambulatory Visit: Payer: Self-pay

## 2021-11-08 ENCOUNTER — Emergency Department (HOSPITAL_COMMUNITY): Payer: BC Managed Care – PPO

## 2021-11-08 ENCOUNTER — Emergency Department (HOSPITAL_COMMUNITY)
Admission: EM | Admit: 2021-11-08 | Discharge: 2021-11-08 | Disposition: A | Discharge status: Home / Self Care | Payer: BC Managed Care – PPO | Attending: Emergency Medicine | Admitting: Emergency Medicine

## 2021-11-08 DIAGNOSIS — R000 Tachycardia, unspecified: Secondary | ICD-10-CM | POA: Insufficient documentation

## 2021-11-08 DIAGNOSIS — M1990 Unspecified osteoarthritis, unspecified site: Secondary | ICD-10-CM | POA: Insufficient documentation

## 2021-11-08 DIAGNOSIS — R002 Palpitations: Secondary | ICD-10-CM | POA: Diagnosis not present

## 2021-11-08 DIAGNOSIS — L4050 Arthropathic psoriasis, unspecified: Secondary | ICD-10-CM | POA: Insufficient documentation

## 2021-11-08 DIAGNOSIS — Z79899 Other long term (current) drug therapy: Secondary | ICD-10-CM | POA: Insufficient documentation

## 2021-11-08 LAB — CBC
HCT: 43.7 % (ref 36.0–46.0)
Hemoglobin: 14.5 g/dL (ref 12.0–15.0)
MCH: 30.5 pg (ref 26.0–34.0)
MCHC: 33.2 g/dL (ref 30.0–36.0)
MCV: 91.8 fL (ref 80.0–100.0)
Platelets: 298 10*3/uL (ref 150–400)
RBC: 4.76 MIL/uL (ref 3.87–5.11)
RDW: 13.2 % (ref 11.5–15.5)
WBC: 11.9 10*3/uL — ABNORMAL HIGH (ref 4.0–10.5)
nRBC: 0 % (ref 0.0–0.2)

## 2021-11-08 LAB — BASIC METABOLIC PANEL
Anion gap: 5 (ref 5–15)
BUN: 15 mg/dL (ref 6–20)
CO2: 25 mmol/L (ref 22–32)
Calcium: 9.2 mg/dL (ref 8.9–10.3)
Chloride: 107 mmol/L (ref 98–111)
Creatinine, Ser: 0.87 mg/dL (ref 0.44–1.00)
GFR, Estimated: >60 mL/min (ref >60)
Glucose, Bld: 95 mg/dL (ref 70–99)
Potassium: 3.7 mmol/L (ref 3.5–5.1)
Sodium: 137 mmol/L (ref 135–145)

## 2021-11-08 LAB — TROPONIN I (HIGH SENSITIVITY): Troponin I (High Sensitivity): 2 ng/L (ref <18)

## 2021-11-08 LAB — TSH: TSH: 4.593 u[IU]/mL — ABNORMAL HIGH (ref 0.350–4.500)

## 2021-11-08 NOTE — ED Notes (Signed)
Pt given hospital gown and asked to undress from waist up and put on hospital gown.

## 2021-11-08 NOTE — Discharge Instructions (Signed)
Your testing today did show a slight abnormality in your thyroid, you may benefit from taking some thyroid medication but this will need to be further tested by family doctor.  If you do not have a family doctor see the list below.  Additionally your heart seems normal, the rest of your blood work was normal, there is no reason for you to have a fast heart rate.  If this continues or worsens come back to the emergency department.  I have given you the phone number for the cardiologist in town so that you can follow-up for Holter monitoring if this continues.  [facility] Primary Care Doctor List    Kari Baars MD. Specialty: Pulmonary Disease Contact information: [address]  [address]  [place] [address]  [phone]   Syliva Overman, MD. Specialty: [facility] Medicine Contact information: [address]  [place] [address]  [phone]   Lilyan Punt, MD. Specialty: [facility] Medicine Contact information: [address]  [place] [address]  [phone]   Avon Gully, MD Specialty: Internal Medicine Contact information: [address] [place] [address]  [phone]   Catalina Pizza, MD. Specialty: Internal Medicine Contact information: [address]  [place] [address]  [phone]    [facility] (Dr. Selena Batten) Specialty: Family Medicine Contact information: [address]  [place] [address]  [phone]   John Giovanni, MD. Specialty: [facility] Medicine Contact information: [address]  [address]  [place] [address]  [phone]   Carylon Perches, MD. Specialty: Internal Medicine Contact information: [address]  [address]  [place] [address]  [phone]    [facility]  [address] [place], [place] [address] [phone]  Services [facility] offers a variety of basic health services.  Services include but are  not limited to: Blood pressure checks  Heart rate checks  Blood sugar checks  Urine analysis  Rapid strep tests  Pregnancy tests.  Health education and referrals  People needing more complex services will be directed to a physician online. Using these virtual visits, doctors can evaluate and prescribe medicine and treatments. There will be no medication on-site, though [facility] will help patients fill their prescriptions at little to no cost.   For More information please go to: [email or web address]

## 2021-11-08 NOTE — ED Notes (Signed)
Pt reports heart palpitations today with a heart rate up to 130s. Currently having mild left sided chest pain with radiation to left arm pit. Husband had a massive heart attack at age 46 and she is concerned about her heart.

## 2021-11-08 NOTE — ED Provider Notes (Signed)
[facility] EMERGENCY DEPARTMENT Provider Note   CSN: [id number] Arrival date & time: 11/08/21  1755     History Chief Complaint  Patient presents with   Palpitations    Emily Love is a 45 y.o. female.   Palpitations  This patient is a 45 year old female, she has a history of obesity, polycystic ovarian syndrome and psoriasis, she is on Stelara as her only daily medication.  She presents to the hospital today with a complaint of palpitations which she states started earlier in the day today.  She states that has been going on all day long however when she came back to the room in the emergency department she had resolution of her symptoms.  The EKG was performed while she was having symptoms and showed sinus tachycardia.  She has not had any unexpected weight loss or weight gain, she has been without using over-the-counter medications, she has only takes Tylenol.  She does not take any weight loss supplements nor does she take any over-the-counter cough or cold medications.  The patient reports to me that she has had no fevers chills nausea vomiting or diarrhea, no urinary symptoms or swelling.  No unexpected rashes, she has not been sick recently.  She has not taken anything for her symptoms.  Past Medical History:  Diagnosis Date   Abnormal uterine bleeding (AUB) 01/26/2015   Arthritis    psoriatic   Complication of anesthesia    Fatigue 01/26/2015   Gestational diabetes    History of PCOS 01/26/2015   LLQ pain 01/26/2015   Obesity    PCOS (polycystic ovarian syndrome)    Psoriasis    Spinal headache    both deliveries    Patient Active Problem List   Diagnosis Date Noted   Morbid obesity (HCC) 11/08/2021   Osteoarthritis 11/08/2021   Other long term (current) drug therapy 11/08/2021   Psoriatic arthritis (HCC) 11/08/2021   Sjogren syndrome, unspecified (HCC) 11/08/2021   Postoperative MRSA infection of lower abdominal wound 03/04/2018   Intertrigo of pannus  01/12/2018   Cesarean section wound seroma, postpartum 01/07/2018   Gestational hypertension without significant proteinuria 12/30/2017   Status post repeat low transverse cesarean section 12/30/2017   Rubella non-immune status, antepartum 07/04/2017   Supervision of high risk pregnancy, antepartum 07/03/2017   AMA (advanced maternal age) multigravida 35+ 07/03/2017   Previous cesarean section complicating pregnancy 07/03/2017   Psoriasis & psoriatic arthritis 07/03/2017   LLQ pain 01/26/2015   Abnormal uterine bleeding (AUB) 01/26/2015   History of PCOS 01/26/2015   Fatigue 01/26/2015    Past Surgical History:  Procedure Laterality Date   CARPAL TUNNEL RELEASE Right 2000   CESAREAN SECTION  2992,[id number]   CESAREAN SECTION WITH BILATERAL TUBAL LIGATION Bilateral 12/30/2017   Procedure: REPEAT CESAREAN SECTION WITH BILATERAL TUBAL LIGATION;  Surgeon: Tilda Burrow, MD;  Location: [facility] BIRTHING SUIT[facility];  Service: Obstetrics;  Laterality: Bilateral;   endometrial biopsy     LAPAROSCOPY FOR ECTOPIC PREGNANCY  1997   lumph node removed  2007   TONSILLECTOMY       OB History     Gravida  5   Para  3   Term  3   Preterm      AB  2   Living  3      SAB  1   IAB      Ectopic  1   Multiple      Live Births  3  Family History  Problem Relation Age of Onset   Hypothyroidism Mother    Diabetes Father    Heart disease Father    Other Father        "something wrong with bones"   Other Maternal Grandfather        brain tumor   Cancer Paternal Grandmother        liver   Epilepsy Son    Narcolepsy Son     Social History   Tobacco Use   Smoking status: Never   Smokeless tobacco: Never  Substance Use Topics   Alcohol use: No   Drug use: No    Home Medications Prior to Admission medications   Medication Sig Start Date End Date Taking? Authorizing Provider  clobetasol cream (TEMOVATE) 0.05 % Apply topically 2 (two) times daily. 07/18/21  Yes  [provider]  ustekinumab Marcy Panning) 90 MG/ML SOSY injection See admin instructions.   Yes [provider]    Allergies    Penicillins, Adalimumab, Leflunomide, Penicillin g, Septra [sulfamethoxazole-trimethoprim], Sulfamethoxazole, and Trimethoprim  Review of Systems   Review of Systems  Cardiovascular:  Positive for palpitations.  All other systems reviewed and are negative.  Physical Exam Updated Vital Signs BP 114/72    Pulse 93    Temp 98.1 F (36.7 C) (Oral)    Resp 16    Ht 1.575 m (5\' 2" )    Wt 97.5 kg    SpO2 96%    BMI 39.32 kg/m   Physical Exam Vitals and nursing note reviewed.  Constitutional:      General: She is not in acute distress.    Appearance: She is well-developed.  HENT:     Head: Normocephalic and atraumatic.     Mouth/Throat:     Pharynx: No oropharyngeal exudate.  Eyes:     General: No scleral icterus.       Right eye: No discharge.        Left eye: No discharge.     Conjunctiva/sclera: Conjunctivae normal.     Pupils: Pupils are equal, round, and reactive to light.  Neck:     Thyroid: No thyromegaly.     Vascular: No JVD.  Cardiovascular:     Rate and Rhythm: Normal rate and regular rhythm.     Heart sounds: Normal heart sounds. No murmur heard.   No friction rub. No gallop.  Pulmonary:     Effort: Pulmonary effort is normal. No respiratory distress.     Breath sounds: Normal breath sounds. No wheezing or rales.  Abdominal:     General: Bowel sounds are normal. There is no distension.     Palpations: Abdomen is soft. There is no mass.     Tenderness: There is no abdominal tenderness.  Musculoskeletal:        General: No tenderness. Normal range of motion.     Cervical back: Normal range of motion and neck supple.  Lymphadenopathy:     Cervical: No cervical adenopathy.  Skin:    General: Skin is warm and dry.     Findings: No erythema or rash.  Neurological:     Mental Status: She is alert.     Coordination:  Coordination normal.  Psychiatric:        Behavior: Behavior normal.    ED Results / Procedures / Treatments   Labs (all labs ordered are listed, but only abnormal results are displayed) Labs Reviewed  CBC - Abnormal; Notable for the following components:  Result Value   WBC 11.9 (*)    All other components within normal limits  TSH - Abnormal; Notable for the following components:   TSH 4.593 (*)    All other components within normal limits  BASIC METABOLIC PANEL  TROPONIN I (HIGH SENSITIVITY)    EKG EKG Interpretation  Date/Time:  Thursday November 08 2021 18:03:48 EST Ventricular Rate:  113 PR Interval:  136 QRS Duration: 74 QT Interval:  336 QTC Calculation: 460 R Axis:   103 Text Interpretation: Sinus tachycardia Rightward axis Low voltage QRS Nonspecific ST and T wave abnormality Abnormal ECG When compared with ECG of 11-Jun-2007 19:25, Questionable change in QRS axis Nonspecific T wave abnormality has replaced inverted T waves in Anterior leads Confirmed by Noemi Chapel [phone]) on 11/08/2021 7:06:31 PM  Radiology DG Chest 2 View  Result Date: 11/08/2021 CLINICAL DATA:  Heart palpitations. EXAM: CHEST - 2 VIEW COMPARISON:  October 13, 2007 FINDINGS: The heart size and mediastinal contours are within normal limits. No focal consolidation. No pleural effusion. No pneumothorax. The visualized skeletal structures are unremarkable. IMPRESSION: No active cardiopulmonary disease. Electronically Signed   By: Dahlia Bailiff M.D.   On: 11/08/2021 18:53    Procedures Procedures   Medications Ordered in ED Medications - No data to display  ED Course  I have reviewed the triage vital signs and the nursing notes.  Pertinent labs & imaging results that were available during my care of the patient were reviewed by me and considered in my medical decision making (see chart for details).    MDM Rules/Calculators/A&P                          On my exam the patient is  actually normotensive, she has a heart rate of 90 bpm, she has normal pulses no edema normal heart sounds and an EKG that showed minimal sinus tachycardia with a rate of 113 bpm.  There is no signs of Wolff-Parkinson-White, no signs of SVT or other tacky arrhythmia.  She has narrow complex.  We will perform a TSH and metabolic panel and cardiac monitoring while in the emergency department  TSH slightly elevated, this can be handled as an outpatient, troponin negative, basic metabolic panel and CBC unremarkable, vitals unremarkable, no tachycardia throughout her entire stay, stable for discharge, recommended follow-up with cardiology for Holter monitoring as an     Final Clinical Impression(s) / ED Diagnoses Final diagnoses:  Palpitations    Rx / DC Orders ED Discharge Orders     None        Noemi Chapel, MD 11/08/21 2037

## 2021-11-08 NOTE — ED Notes (Signed)
Pt placed on cardiac monitor with BP to set cycle every 30 minutes. Continuous pulse oximeter applied.  

## 2021-11-08 NOTE — ED Triage Notes (Signed)
Reports heart is racing and she can't get it to slow down.  +nausea

## 2021-12-18 ENCOUNTER — Ambulatory Visit (INDEPENDENT_AMBULATORY_CARE_PROVIDER_SITE_OTHER): Payer: BC Managed Care – PPO | Admitting: Family Medicine

## 2021-12-18 ENCOUNTER — Other Ambulatory Visit: Payer: Self-pay

## 2021-12-18 VITALS — BP 121/90 | HR 109 | Temp 98.5°F | Ht 62.0 in | Wt 220.4 lb

## 2021-12-18 DIAGNOSIS — R7989 Other specified abnormal findings of blood chemistry: Secondary | ICD-10-CM

## 2021-12-18 MED ORDER — SEMAGLUTIDE-WEIGHT MANAGEMENT 0.25 MG/0.5ML ~~LOC~~ SOAJ: 0.2500 mg | SUBCUTANEOUS | 0 refills | Status: DC

## 2021-12-18 NOTE — Patient Instructions (Signed)
We will call with the results.  I sent in the [facility]. We will see if it gets approved.  Take care  Dr. Lacinda Axon

## 2021-12-18 NOTE — Assessment & Plan Note (Signed)
I suspect that this is subclinical hypothyroidism or a spurious test result.  Additionally, no T4 was obtained.  Obtaining TSH and T4.

## 2021-12-18 NOTE — Progress Notes (Signed)
Subjective:  Patient ID: Emily Love, female    DOB: 1976/05/05  Age: 46 y.o. MRN: [id number]  CC: Chief Complaint  Patient presents with   New patient/establish care   thyroid issues    HPI:  46 year old female with a history of morbid obesity, osteoarthritis, psoriasis and psoriatic arthritis presents to establish care with me.  Patient recently seen in the ER on 12/29 for evaluation of palpitations.  TSH was obtained and was mildly elevated.  She states that the ER informed her that she needed to start thyroid medication.  Patient would like to discuss this today.  Patient endorses difficulties with losing weight and also hair thinning and weight loss.  Patient is watching her diet and exercising and is still not able to lose weight.  She would like to discuss treatment options regarding weight loss today.  Patient Active Problem List   Diagnosis Date Noted   Elevated TSH 12/18/2021   Morbid obesity (HCC) 11/08/2021   Osteoarthritis 11/08/2021   Other long term (current) drug therapy 11/08/2021   Psoriatic arthritis (HCC) 11/08/2021   History of PCOS 01/26/2015    Social Hx   Social History   Socioeconomic History   Marital status: Married    Spouse name: Not on file   Number of children: Not on file   Years of education: Not on file   Highest education level: Not on file  Occupational History   Not on file  Tobacco Use   Smoking status: Never   Smokeless tobacco: Never  Substance and Sexual Activity   Alcohol use: No   Drug use: No   Sexual activity: Yes    Birth control/protection: Surgical    Comment: tubal  Other Topics Concern   Not on file  Social History Narrative   Not on file   Social Determinants of Health   Financial Resource Strain: Not on file  Food Insecurity: Not on file  Transportation Needs: Not on file  Physical Activity: Not on file  Stress: Not on file  Social Connections: Not on file    Review of Systems  Constitutional:         Weight gain.  Cardiovascular:  Positive for palpitations.   Objective:  BP 121/90    Pulse (!) 109    Temp 98.5 F (36.9 C) (Oral)    Ht 5\' 2"  (1.575 m)    Wt 220 lb 6.4 oz (100 kg)    SpO2 97%    BMI 40.31 kg/m   BP/Weight 12/18/2021 11/08/2021 03/24/2021  Systolic BP 121 111 125  Diastolic BP 90 58 85  Wt. (Lbs) 220.4 215 -  BMI 40.31 39.32 -    Physical Exam Vitals and nursing note reviewed.  Constitutional:      Appearance: Normal appearance. She is obese.  Cardiovascular:     Rate and Rhythm: Regular rhythm. Tachycardia present.  Pulmonary:     Effort: Pulmonary effort is normal.     Breath sounds: Normal breath sounds. No wheezing, rhonchi or rales.  Neurological:     Mental Status: She is alert.  Psychiatric:        Mood and Affect: Mood normal.        Behavior: Behavior normal.    Lab Results  Component Value Date   WBC 11.9 (H) 11/08/2021   HGB 14.5 11/08/2021   HCT 43.7 11/08/2021   PLT 298 11/08/2021   GLUCOSE 95 11/08/2021   ALT 15 12/30/2017   AST 21 12/30/2017  NA 137 11/08/2021   K 3.7 11/08/2021   CL 107 11/08/2021   CREATININE 0.87 11/08/2021   BUN 15 11/08/2021   CO2 25 11/08/2021   TSH 4.593 (H) 11/08/2021     Assessment & Plan:   Problem List Items Addressed This Visit       Other   Morbid obesity (HCC)    GLP-1 is a good option for her.  Sending in [place].      Relevant Medications   Semaglutide-Weight Management 0.25 MG/0.5ML SOAJ   Elevated TSH - Primary    I suspect that this is subclinical hypothyroidism or a spurious test result.  Additionally, no T4 was obtained.  Obtaining TSH and T4.       Relevant Orders   TSH + free T4    Meds ordered this encounter  Medications   Semaglutide-Weight Management 0.25 MG/0.5ML SOAJ    Sig: Inject 0.25 mg into the skin once a week.    Dispense:  2 mL    Refill:  0    Follow-up:  Return in about 6 months (around 06/17/2022).  Everlene Other DO [facility]

## 2021-12-18 NOTE — Assessment & Plan Note (Signed)
GLP-1 is a good option for her.  Sending in [place].

## 2021-12-19 LAB — TSH+FREE T4
Free T4: 1.09 ng/dL (ref 0.82–1.77)
TSH: 3.17 u[IU]/mL (ref 0.450–4.500)

## 2021-12-26 DIAGNOSIS — L4050 Arthropathic psoriasis, unspecified: Secondary | ICD-10-CM | POA: Diagnosis not present

## 2022-01-09 DIAGNOSIS — M549 Dorsalgia, unspecified: Secondary | ICD-10-CM | POA: Diagnosis not present

## 2022-01-09 DIAGNOSIS — Z79899 Other long term (current) drug therapy: Secondary | ICD-10-CM | POA: Diagnosis not present

## 2022-01-09 DIAGNOSIS — L409 Psoriasis, unspecified: Secondary | ICD-10-CM | POA: Diagnosis not present

## 2022-01-09 DIAGNOSIS — M533 Sacrococcygeal disorders, not elsewhere classified: Secondary | ICD-10-CM | POA: Diagnosis not present

## 2022-01-09 DIAGNOSIS — E669 Obesity, unspecified: Secondary | ICD-10-CM | POA: Diagnosis not present

## 2022-01-09 DIAGNOSIS — E8881 Metabolic syndrome: Secondary | ICD-10-CM | POA: Diagnosis not present

## 2022-01-09 DIAGNOSIS — L4050 Arthropathic psoriasis, unspecified: Secondary | ICD-10-CM | POA: Diagnosis not present

## 2022-03-13 ENCOUNTER — Telehealth: Payer: Self-pay

## 2022-03-13 NOTE — Telephone Encounter (Signed)
Coral Spikes, DO   ? ?Just inform patient that this was denied by her insurance company.   ? ?

## 2022-03-13 NOTE — Telephone Encounter (Signed)
Pharmacy stated the medication was denied and try PA. Patient has [employer] of [place]- [place] could not be covered by [employer] my meds. Called patient's insurance. Spoke with representative for PAs and she was unable to find Wegovy in the list to even start a PA on med and stated that this most likely means they do not cover med. Please advise ?

## 2022-03-13 NOTE — Telephone Encounter (Signed)
Caller name:Heena Showmaker  ? ?On DPR? :No ? ?Call back number:[phone] ? ?Provider they see: Lacinda Axon ?Reason for call:Pt is calling she is saying she seen Dr Lacinda Axon back in March and was waiting on approval for medication Wegovy  ?And has not heard anything  ?

## 2022-03-13 NOTE — Telephone Encounter (Signed)
Patient notified and verbalized understanding. 

## 2022-03-20 DIAGNOSIS — L4050 Arthropathic psoriasis, unspecified: Secondary | ICD-10-CM | POA: Diagnosis not present

## 2022-03-26 DIAGNOSIS — Z79899 Other long term (current) drug therapy: Secondary | ICD-10-CM | POA: Diagnosis not present

## 2022-03-26 DIAGNOSIS — L4050 Arthropathic psoriasis, unspecified: Secondary | ICD-10-CM | POA: Diagnosis not present

## 2022-03-26 DIAGNOSIS — M549 Dorsalgia, unspecified: Secondary | ICD-10-CM | POA: Diagnosis not present

## 2022-03-26 DIAGNOSIS — L409 Psoriasis, unspecified: Secondary | ICD-10-CM | POA: Diagnosis not present

## 2022-06-12 DIAGNOSIS — L4050 Arthropathic psoriasis, unspecified: Secondary | ICD-10-CM | POA: Diagnosis not present

## 2022-06-17 ENCOUNTER — Ambulatory Visit: Payer: Self-pay | Admitting: Family Medicine

## 2022-06-18 DIAGNOSIS — M25562 Pain in left knee: Secondary | ICD-10-CM | POA: Diagnosis not present

## 2022-06-18 DIAGNOSIS — M13862 Other specified arthritis, left knee: Secondary | ICD-10-CM | POA: Diagnosis not present

## 2022-07-08 DIAGNOSIS — M25562 Pain in left knee: Secondary | ICD-10-CM | POA: Diagnosis not present

## 2022-08-08 IMAGING — DX DG CHEST 2V
2 series · 2 of 2 positions shown · non-contrast
Comparison: October 13, 2007

CLINICAL DATA: Heart palpitations.

EXAM:
CHEST - 2 VIEW

[chest pa]
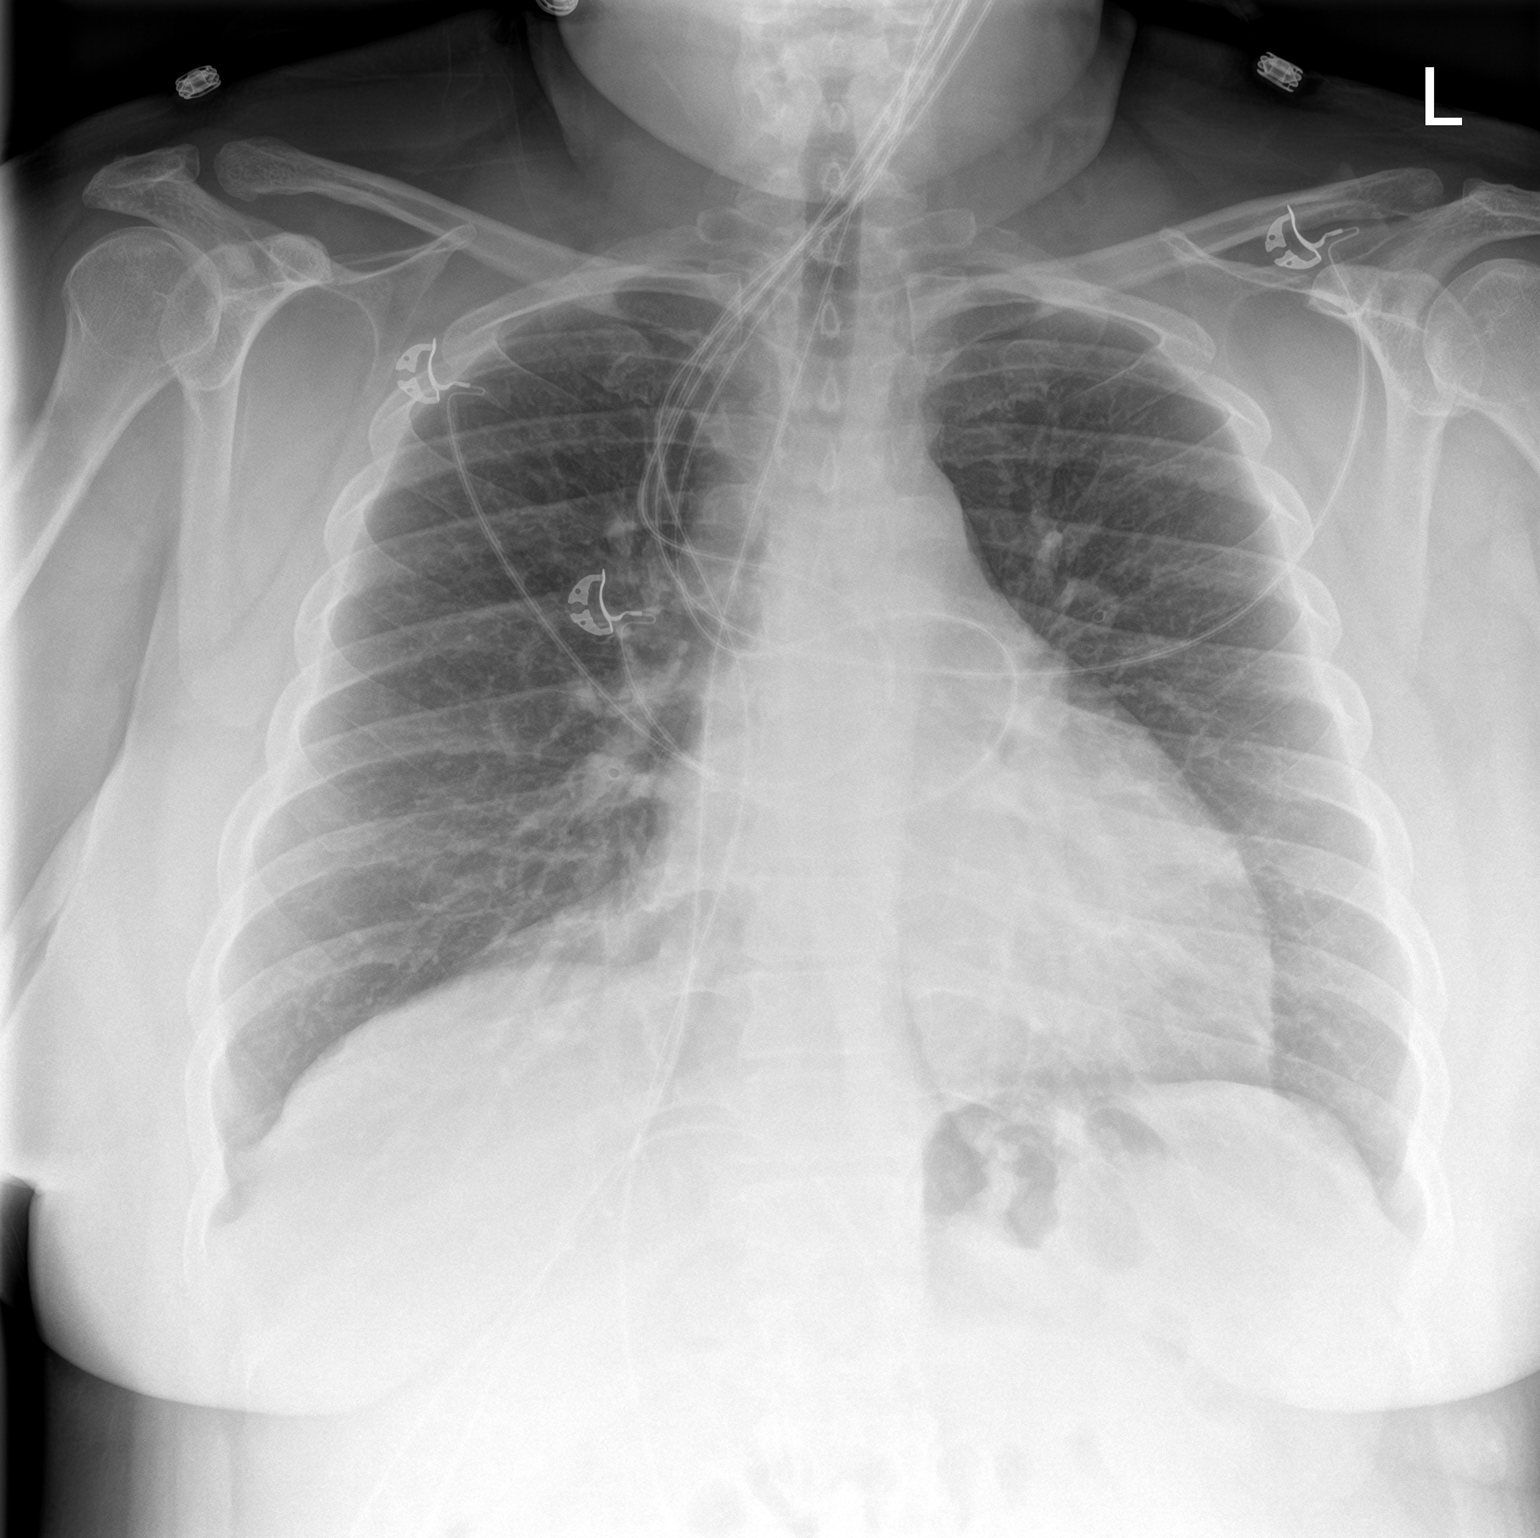

[chest lat]
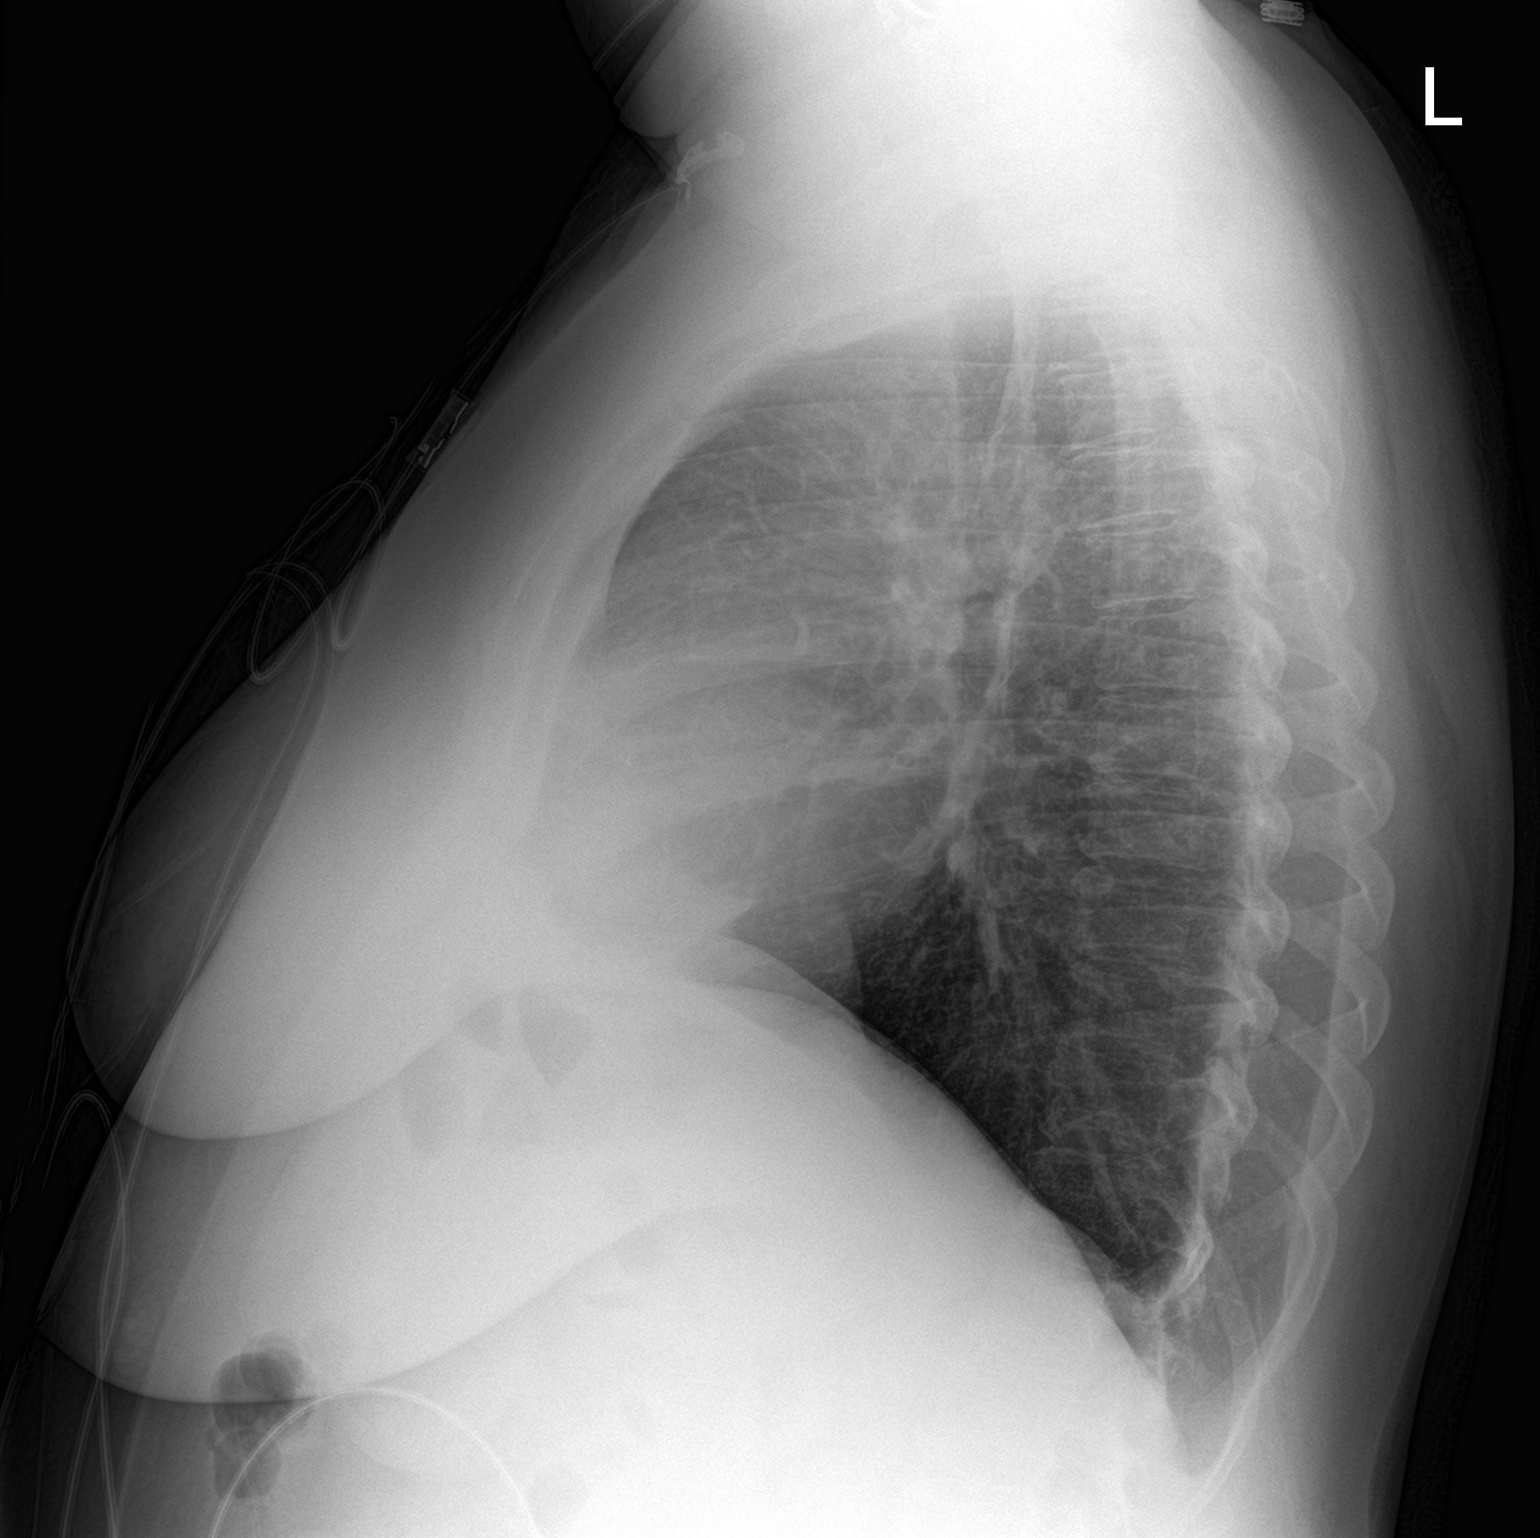

[2 of 2 positions shown; findings below may reference images not displayed]

FINDINGS: The heart size and mediastinal contours are within normal limits. No
focal consolidation. No pleural effusion. No pneumothorax. The
visualized skeletal structures are unremarkable.
IMPRESSION: No active cardiopulmonary disease.

## 2022-09-03 ENCOUNTER — Other Ambulatory Visit (HOSPITAL_COMMUNITY): Payer: Self-pay | Admitting: Family Medicine

## 2022-09-03 DIAGNOSIS — Z1231 Encounter for screening mammogram for malignant neoplasm of breast: Secondary | ICD-10-CM

## 2022-09-04 DIAGNOSIS — L4050 Arthropathic psoriasis, unspecified: Secondary | ICD-10-CM | POA: Diagnosis not present

## 2022-09-11 ENCOUNTER — Inpatient Hospital Stay (HOSPITAL_COMMUNITY): Admission: RE | Admit: 2022-09-11 | Payer: BC Managed Care – PPO | Source: Ambulatory Visit

## 2022-09-11 ENCOUNTER — Ambulatory Visit (HOSPITAL_COMMUNITY)
Admission: RE | Admit: 2022-09-11 | Discharge: 2022-09-11 | Disposition: A | Discharge status: Home / Self Care | Payer: BC Managed Care – PPO | Source: Ambulatory Visit | Attending: Family Medicine | Admitting: Family Medicine

## 2022-09-11 ENCOUNTER — Encounter (HOSPITAL_COMMUNITY): Payer: Self-pay

## 2022-09-11 DIAGNOSIS — Z1231 Encounter for screening mammogram for malignant neoplasm of breast: Secondary | ICD-10-CM | POA: Insufficient documentation

## 2022-09-13 ENCOUNTER — Other Ambulatory Visit (HOSPITAL_COMMUNITY): Payer: Self-pay | Admitting: Family Medicine

## 2022-09-13 DIAGNOSIS — R928 Other abnormal and inconclusive findings on diagnostic imaging of breast: Secondary | ICD-10-CM

## 2022-09-17 DIAGNOSIS — M1990 Unspecified osteoarthritis, unspecified site: Secondary | ICD-10-CM | POA: Diagnosis not present

## 2022-09-17 DIAGNOSIS — L4050 Arthropathic psoriasis, unspecified: Secondary | ICD-10-CM | POA: Diagnosis not present

## 2022-09-17 DIAGNOSIS — L409 Psoriasis, unspecified: Secondary | ICD-10-CM | POA: Diagnosis not present

## 2022-09-17 DIAGNOSIS — Z79899 Other long term (current) drug therapy: Secondary | ICD-10-CM | POA: Diagnosis not present

## 2022-09-17 DIAGNOSIS — Z23 Encounter for immunization: Secondary | ICD-10-CM | POA: Diagnosis not present

## 2022-09-18 ENCOUNTER — Ambulatory Visit (HOSPITAL_COMMUNITY)
Admission: RE | Admit: 2022-09-18 | Discharge: 2022-09-18 | Disposition: A | Discharge status: Home / Self Care | Payer: BC Managed Care – PPO | Source: Ambulatory Visit | Attending: Family Medicine | Admitting: Family Medicine

## 2022-09-18 DIAGNOSIS — R928 Other abnormal and inconclusive findings on diagnostic imaging of breast: Secondary | ICD-10-CM | POA: Diagnosis not present

## 2022-09-18 DIAGNOSIS — N6489 Other specified disorders of breast: Secondary | ICD-10-CM | POA: Diagnosis not present

## 2022-09-18 DIAGNOSIS — N6312 Unspecified lump in the right breast, upper inner quadrant: Secondary | ICD-10-CM | POA: Diagnosis not present

## 2022-09-25 ENCOUNTER — Telehealth: Payer: Self-pay

## 2022-09-25 NOTE — Telephone Encounter (Signed)
Nurses-please talk with Emily Love she spoke with family to arrange for office visit or televisit on Thursday

## 2022-09-25 NOTE — Telephone Encounter (Addendum)
Covid positive today with home test , fever of 100.0, sinus congestion and sore throat, cough non prod. Symptoms started 2 days ago,  please advise   --- spoke with patient and she decided her symptoms are very mild and declines an appt at this time

## 2022-09-26 ENCOUNTER — Telehealth (INDEPENDENT_AMBULATORY_CARE_PROVIDER_SITE_OTHER): Payer: BC Managed Care – PPO | Admitting: Family Medicine

## 2022-09-26 ENCOUNTER — Telehealth: Payer: Self-pay

## 2022-09-26 DIAGNOSIS — U071 COVID-19: Secondary | ICD-10-CM | POA: Diagnosis not present

## 2022-09-26 MED ORDER — NIRMATRELVIR/RITONAVIR (PAXLOVID)TABLET: 3.0000 | ORAL_TABLET | Freq: Two times a day (BID) | ORAL | 0 refills | Status: AC

## 2022-09-26 NOTE — Telephone Encounter (Signed)
I connected with  Emily Love on 09/26/22 by a video enabled telemedicine application and verified that I am speaking with the correct person using two identifiers.   I discussed the limitations of evaluation and management by telemedicine. The patient expressed understanding and agreed to proceed.

## 2022-09-26 NOTE — Progress Notes (Signed)
   Subjective:    Patient ID: Emily Love, female    DOB: 06-Mar-1976, 46 y.o.   MRN: [id number]  HPI I connected with  Emily Love on 09/26/22 by a video enabled telemedicine application and verified that I am speaking with the correct person using two identifiers.   I discussed the limitations of evaluation and management by telemedicine. The patient expressed understanding and agreed to proceed.  Patient location: home  Provider location: in office  I provided 15 minutes of non face - to - face time during this encounter.   Covid positive today with home test , fever of 100.0, sinus congestion and sore throat, cough non prod. Symptoms started 2 days ago,  please advise patient denies any significant shortness of breath.  She does state she has cough congestion body aches does not feel good Review of Systems     Objective:   Physical Exam  Patient had virtual visit-video Appears to be in no distress Atraumatic Neuro able to relate and oriented No apparent resp distress Color normal       Assessment & Plan:  COVID Warning signs discussed  Covid infection This is a viral process.  Mild cases are treated with supportive measures at home such as Tylenol rest fluids.  In some situations monoclonal antibodies may be appropriate depending on the patient's risk criteria.  The patient was educated regarding progressive illness including respiratory, persistent vomiting, change in mental status.  If any of these occur ER evaluation is recommended. Patient was educated about the following as well Covid-19 respiratory warning: Covid-19 is a virus that causes hypoxia (low oxygen level in blood) in some people. If you develop any changes in your usual breathing pattern: difficulty catching your breath, more short winded with activity or with resting, or anything that concerns you about your breathing, do not hesitate to go to the emergency department immediately for  evaluation. Please do not delay to get treatment.   Agrees with plan of care discussed today. Understands warning signs to seek further care: Chest pain, shortness of breath, mental confusion, profuse vomiting, any significant change in health. Understands to follow-up if symptoms do not improve, or worsen.    Paxlovid Based upon previous GDR would be fine for her to continue with the standard dosing.  This medication does get along with her current dosing of other medicines

## 2022-10-13 ENCOUNTER — Ambulatory Visit
Admission: EM | Admit: 2022-10-13 | Discharge: 2022-10-13 | Disposition: A | Discharge status: Home / Self Care | Payer: BC Managed Care – PPO | Attending: Family Medicine | Admitting: Family Medicine

## 2022-10-13 DIAGNOSIS — T7840XA Allergy, unspecified, initial encounter: Secondary | ICD-10-CM

## 2022-10-13 MED ORDER — METHYLPREDNISOLONE SODIUM SUCC 125 MG IJ SOLR
80.0000 mg | Freq: Once | INTRAMUSCULAR | Status: AC
  Administered 2022-10-13: 80 mg via INTRAMUSCULAR

## 2022-10-13 NOTE — Discharge Instructions (Signed)
Take 40 mg of Pepcid along with Zyrtec twice daily until the rash resolves

## 2022-10-13 NOTE — ED Provider Notes (Signed)
[facility]    CSN: [id number] Arrival date & time: 10/13/22  1205      History   Chief Complaint No chief complaint on file.   HPI Emily Love is a 46 y.o. female.   Patient presenting today with diffuse itchy rash after starting Plaquenil 5 days ago.  States the rash happened very shortly after starting the medication and she has since discontinued it and called her rheumatologist who told her to take Zyrtec and hydrocortisone cream.  States these are not helping and the rash is widespread across her whole body.  Denies throat itching or swelling, difficulty breathing or swallowing, nausea, vomiting.    Past Medical History:  Diagnosis Date   Abnormal uterine bleeding (AUB) 01/26/2015   Arthritis    psoriatic   Complication of anesthesia    Fatigue 01/26/2015   Gestational diabetes    History of PCOS 01/26/2015   LLQ pain 01/26/2015   Obesity    PCOS (polycystic ovarian syndrome)    Psoriasis    Spinal headache    both deliveries    Patient Active Problem List   Diagnosis Date Noted   Elevated TSH 12/18/2021   Morbid obesity (HCC) 11/08/2021   Osteoarthritis 11/08/2021   Other long term (current) drug therapy 11/08/2021   Psoriatic arthritis (HCC) 11/08/2021   History of PCOS 01/26/2015    Past Surgical History:  Procedure Laterality Date   CARPAL TUNNEL RELEASE Right 2000   CESAREAN SECTION  6063,0160   CESAREAN SECTION WITH BILATERAL TUBAL LIGATION Bilateral 12/30/2017   Procedure: REPEAT CESAREAN SECTION WITH BILATERAL TUBAL LIGATION;  Surgeon: Tilda Burrow, MD;  Location: [facility] BIRTHING SUITES;  Service: Obstetrics;  Laterality: Bilateral;   endometrial biopsy     LAPAROSCOPY FOR ECTOPIC PREGNANCY  1997   lumph node removed  2007   TONSILLECTOMY      OB History     Gravida  5   Para  3   Term  3   Preterm      AB  2   Living  3      SAB  1   IAB      Ectopic  1   Multiple      Live Births  3             Home Medications    Prior to Admission medications   Medication Sig Start Date End Date Taking? Authorizing Provider  clobetasol cream (TEMOVATE) 0.05 % Apply topically 2 (two) times daily. 07/18/21   [provider]  Semaglutide-Weight Management 0.25 MG/0.5ML SOAJ Inject 0.25 mg into the skin once a week. 12/18/21   Tommie Sams, DO  ustekinumab (STELARA) 90 MG/ML SOSY injection See admin instructions.    [provider]    Family History Family History  Problem Relation Age of Onset   Hypothyroidism Mother    Diabetes Father    Heart disease Father    Other Father        "something wrong with bones"   Other Maternal Grandfather        brain tumor   Cancer Paternal Grandmother        liver   Epilepsy Son    Narcolepsy Son     Social History Social History   Tobacco Use   Smoking status: Never   Smokeless tobacco: Never  Substance Use Topics   Alcohol use: No   Drug use: No     Allergies  Penicillins, Adalimumab, Leflunomide, Penicillin g, Plaquenil [hydroxychloroquine], Septra [sulfamethoxazole-trimethoprim], Sulfamethoxazole, and Trimethoprim   Review of Systems Review of Systems Per HPI  Physical Exam Triage Vital Signs ED Triage Vitals  Enc Vitals Group     BP 10/13/22 1339 137/89     Pulse Rate 10/13/22 1339 87     Resp 10/13/22 1339 18     Temp 10/13/22 1339 97.9 F (36.6 C)     Temp Source 10/13/22 1339 Oral     SpO2 10/13/22 1339 97 %     Weight --      Height --      Head Circumference --      Peak Flow --      Pain Score 10/13/22 1343 0     Pain Loc --      Pain Edu? --      Excl. in GC? --    No data found.  Updated Vital Signs BP 137/89 (BP Location: Right Arm)   Pulse 87   Temp 97.9 F (36.6 C) (Oral)   Resp 18   LMP 10/05/2022   SpO2 97%   Visual Acuity Right Eye Distance:   Left Eye Distance:   Bilateral Distance:    Right Eye Near:   Left Eye Near:    Bilateral Near:     Physical Exam Vitals  and nursing note reviewed.  Constitutional:      Appearance: Normal appearance. She is not ill-appearing.  HENT:     Head: Atraumatic.     Nose: Nose normal.     Mouth/Throat:     Mouth: Mucous membranes are moist.     Pharynx: No oropharyngeal exudate or posterior oropharyngeal erythema.  Eyes:     Extraocular Movements: Extraocular movements intact.     Conjunctiva/sclera: Conjunctivae normal.  Cardiovascular:     Rate and Rhythm: Normal rate and regular rhythm.     Heart sounds: Normal heart sounds.  Pulmonary:     Effort: Pulmonary effort is normal.     Breath sounds: Normal breath sounds.  Musculoskeletal:        General: Normal range of motion.     Cervical back: Normal range of motion and neck supple.  Skin:    General: Skin is warm and dry.     Findings: Rash present.  Neurological:     Mental Status: She is alert and oriented to person, place, and time.  Psychiatric:        Mood and Affect: Mood normal.        Thought Content: Thought content normal.        Judgment: Judgment normal.    UC Treatments / Results  Labs (all labs ordered are listed, but only abnormal results are displayed) Labs Reviewed - No data to display  EKG  Radiology No results found.  Procedures Procedures (including critical care time)  Medications Ordered in UC Medications  methylPREDNISolone sodium succinate (SOLU-MEDROL) 125 mg/2 mL injection 80 mg (80 mg Intramuscular Given 10/13/22 1442)    Initial Impression / Assessment and Plan / UC Course  I have reviewed the triage vital signs and the nursing notes.  Pertinent labs & imaging results that were available during my care of the patient were reviewed by me and considered in my medical decision making (see chart for details).     Consistent with drug reaction rash, no systemic symptoms such as difficulty breathing, throat itching or swelling apparent today and her vital signs are stable, she appears in  no acute distress.  Will  treat with IM Solu-Medrol, Pepcid and Zyrtec twice daily, over-the-counter creams as needed.  Follow-up with rheumatologist regarding neck steps.  Plaquenil is added to allergy list  Final Clinical Impressions(s) / UC Diagnoses   Final diagnoses:  Allergic reaction, initial encounter     Discharge Instructions      Take 40 mg of Pepcid along with Zyrtec twice daily until the rash resolves    ED Prescriptions   None    PDMP not reviewed this encounter.   Particia Nearing, [place] 10/13/22 1504

## 2022-10-13 NOTE — ED Triage Notes (Signed)
Pt reports she is having an allergic reaction to plaquenil that started Monday. Took zrtec and benadryl cream which didn't help. Fingers and feet really swollen

## 2022-10-18 ENCOUNTER — Ambulatory Visit
Admission: RE | Admit: 2022-10-18 | Discharge: 2022-10-18 | Disposition: A | Discharge status: Home / Self Care | Payer: BC Managed Care – PPO | Source: Ambulatory Visit | Attending: Nurse Practitioner | Admitting: Nurse Practitioner

## 2022-10-18 ENCOUNTER — Other Ambulatory Visit: Payer: Self-pay

## 2022-10-18 VITALS — BP 125/77 | HR 98 | Temp 98.1°F | Resp 20

## 2022-10-18 DIAGNOSIS — L500 Allergic urticaria: Secondary | ICD-10-CM

## 2022-10-18 MED ORDER — DEXAMETHASONE SODIUM PHOSPHATE 10 MG/ML IJ SOLN
10.0000 mg | Freq: Once | INTRAMUSCULAR | Status: AC
  Administered 2022-10-18: 10 mg via INTRAMUSCULAR

## 2022-10-18 NOTE — ED Triage Notes (Addendum)
Pt reports was seen for allergic reaction on 12/3 that is believed to be related to plaquenil and reports received injection at [facility] and states "pinpoint rash" is now "hives everywhere".  Pt reports similar reaction to prednisone. airway patent. Has tried zyrtec and pepcid for itching and reports itching sensation remains. BLE swelling also continues.

## 2022-10-18 NOTE — ED Provider Notes (Signed)
[facility]    CSN: [id number] Arrival date & time: 10/18/22  1521      History   Chief Complaint Chief Complaint  Patient presents with   Allergic Reaction    Entered by patient    HPI Emily Love is a 46 y.o. female.   Patient presents for "hives" that have been present for the past 2 weeks.  Reports it initially started after she increased dose of Plaquenil.  Reports she was seen in urgent care initially and treated with a dose of steroid shot that helped initially, however symptoms have recurred.  Reports the "hives" are on both arms, and all over her body.  It is intensely itchy and raised and red.  No oozing, scaling, blisters, or pain.  No recent fevers or nausea/vomiting.  No throat or tongue swelling, shortness of breath, or difficulty breathing.  No recent change in detergents, soaps, personal care products.  She has been taking Pepcid and Zyrtec daily as previously recommended without improvement.  She follows with rheumatology regularly.  She is also having swelling in her hands and ankles that she thinks is secondary to the hives and is requesting 2 days of Lasix.    Past Medical History:  Diagnosis Date   Abnormal uterine bleeding (AUB) 01/26/2015   Arthritis    psoriatic   Complication of anesthesia    Fatigue 01/26/2015   Gestational diabetes    History of PCOS 01/26/2015   LLQ pain 01/26/2015   Obesity    PCOS (polycystic ovarian syndrome)    Psoriasis    Spinal headache    both deliveries    Patient Active Problem List   Diagnosis Date Noted   Elevated TSH 12/18/2021   Morbid obesity (HCC) 11/08/2021   Osteoarthritis 11/08/2021   Other long term (current) drug therapy 11/08/2021   Psoriatic arthritis (HCC) 11/08/2021   History of PCOS 01/26/2015    Past Surgical History:  Procedure Laterality Date   CARPAL TUNNEL RELEASE Right 2000   CESAREAN SECTION  [id number]   CESAREAN SECTION WITH BILATERAL TUBAL LIGATION Bilateral  12/30/2017   Procedure: REPEAT CESAREAN SECTION WITH BILATERAL TUBAL LIGATION;  Surgeon: Tilda Burrow, MD;  Location: [facility] SU[facility];  Service: Obstetrics;  Laterality: Bilateral;   endometrial biopsy     LAPAROSCOPY FOR ECTOPIC PREGNANCY  1997   lumph node removed  2007   TONSILLECTOMY      OB History     Gravida  5   Para  3   Term  3   Preterm      AB  2   Living  3      SAB  1   IAB      Ectopic  1   Multiple      Live Births  3            Home Medications    Prior to Admission medications   Medication Sig Start Date End Date Taking? Authorizing Provider  clobetasol cream (TEMOVATE) 0.05 % Apply topically 2 (two) times daily. 07/18/21   [provider]  Semaglutide-Weight Management 0.25 MG/0.5ML SOAJ Inject 0.25 mg into the skin once a week. 12/18/21   Tommie Sams, DO  ustekinumab (STELARA) 90 MG/ML SOSY injection See admin instructions.    [provider]    Family History Family History  Problem Relation Age of Onset   Hypothyroidism Mother    Diabetes Father    Heart disease Father  Other Father        "something wrong with bones"   Other Maternal Grandfather        brain tumor   Cancer Paternal Grandmother        liver   Epilepsy Son    Narcolepsy Son     Social History Social History   Tobacco Use   Smoking status: Never   Smokeless tobacco: Never  Substance Use Topics   Alcohol use: No   Drug use: No     Allergies   Penicillins, Adalimumab, Leflunomide, Penicillin g, Plaquenil [hydroxychloroquine], Septra [sulfamethoxazole-trimethoprim], Sulfamethoxazole, and Trimethoprim   Review of Systems Review of Systems Per HPI  Physical Exam Triage Vital Signs ED Triage Vitals  Enc Vitals Group     BP 10/18/22 1607 125/77     Pulse Rate 10/18/22 1607 98     Resp 10/18/22 1607 20     Temp 10/18/22 1607 98.1 F (36.7 C)     Temp Source 10/18/22 1607 Oral     SpO2 10/18/22 1607 96 %     Weight --       Height --      Head Circumference --      Peak Flow --      Pain Score 10/18/22 1604 0     Pain Loc --      Pain Edu? --      Excl. in GC? --    No data found.  Updated Vital Signs BP 125/77 (BP Location: Right Arm)   Pulse 98   Temp 98.1 F (36.7 C) (Oral)   Resp 20   LMP 10/05/2022 (Approximate)   SpO2 96%   Visual Acuity Right Eye Distance:   Left Eye Distance:   Bilateral Distance:    Right Eye Near:   Left Eye Near:    Bilateral Near:     Physical Exam Vitals and nursing note reviewed.  Constitutional:      General: She is not in acute distress.    Appearance: Normal appearance. She is not toxic-appearing.  HENT:     Mouth/Throat:     Mouth: Mucous membranes are moist.     Pharynx: Oropharynx is clear. No oropharyngeal exudate or posterior oropharyngeal erythema.  Pulmonary:     Effort: Pulmonary effort is normal. No respiratory distress.  Skin:    General: Skin is warm and dry.     Capillary Refill: Capillary refill takes less than 2 seconds.     Findings: Erythema and rash present.     Comments: Slightly erythematous, macular papular rash to bilateral forearms.  No warmth, edema, active drainage to rash.  Neurological:     Mental Status: She is alert and oriented to person, place, and time.  Psychiatric:        Behavior: Behavior is cooperative.      UC Treatments / Results  Labs (all labs ordered are listed, but only abnormal results are displayed) Labs Reviewed - No data to display  EKG   Radiology No results found.  Procedures Procedures (including critical care time)  Medications Ordered in UC Medications  dexamethasone (DECADRON) injection 10 mg (10 mg Intramuscular Given 10/18/22 1627)    Initial Impression / Assessment and Plan / UC Course  I have reviewed the triage vital signs and the nursing notes.  Pertinent labs & imaging results that were available during my care of the patient were reviewed by me and considered in my  medical decision making (see chart for details).  Patient is well-appearing, normotensive, afebrile, not tachycardic, not tachypneic, oxygenating well on room air.    Allergic urticaria Unclear etiology Will treat with dexamethasone 10 mg IM Recommended close follow-up with PCP or rheumatology with no improvement in symptoms Continue Pepcid and Zyrtec until completion Discussed with her to keep arms and legs elevated, use compression garments for swelling Strict ER precautions discussed  The patient was given the opportunity to ask questions.  All questions answered to their satisfaction.  The patient is in agreement to this plan.   Final Clinical Impressions(s) / UC Diagnoses   Final diagnoses:  Allergic urticaria     Discharge Instructions      We have given you a shot of steroid today to help with the itching.  Continue the Pepcid and Zyrtec until the rash fully improves.  Keep your legs and feet elevated to help with swelling.    ED Prescriptions   None    PDMP not reviewed this encounter.   Valentino Nose, NP 10/18/22 1659

## 2022-10-18 NOTE — Discharge Instructions (Addendum)
We have given you a shot of steroid today to help with the itching.  Continue the Pepcid and Zyrtec until the rash fully improves.  Keep your legs and feet elevated to help with swelling.

## 2022-11-13 DIAGNOSIS — L409 Psoriasis, unspecified: Secondary | ICD-10-CM | POA: Diagnosis not present

## 2022-11-13 DIAGNOSIS — L4050 Arthropathic psoriasis, unspecified: Secondary | ICD-10-CM | POA: Diagnosis not present

## 2022-11-13 DIAGNOSIS — L270 Generalized skin eruption due to drugs and medicaments taken internally: Secondary | ICD-10-CM | POA: Diagnosis not present

## 2022-11-13 DIAGNOSIS — M1990 Unspecified osteoarthritis, unspecified site: Secondary | ICD-10-CM | POA: Diagnosis not present

## 2022-11-13 DIAGNOSIS — Z79899 Other long term (current) drug therapy: Secondary | ICD-10-CM | POA: Diagnosis not present

## 2022-12-31 DIAGNOSIS — Z79899 Other long term (current) drug therapy: Secondary | ICD-10-CM | POA: Diagnosis not present

## 2022-12-31 DIAGNOSIS — L4050 Arthropathic psoriasis, unspecified: Secondary | ICD-10-CM | POA: Diagnosis not present

## 2022-12-31 DIAGNOSIS — M549 Dorsalgia, unspecified: Secondary | ICD-10-CM | POA: Diagnosis not present

## 2022-12-31 DIAGNOSIS — L409 Psoriasis, unspecified: Secondary | ICD-10-CM | POA: Diagnosis not present

## 2022-12-31 DIAGNOSIS — M1990 Unspecified osteoarthritis, unspecified site: Secondary | ICD-10-CM | POA: Diagnosis not present

## 2022-12-31 DIAGNOSIS — M533 Sacrococcygeal disorders, not elsewhere classified: Secondary | ICD-10-CM | POA: Diagnosis not present

## 2023-02-11 ENCOUNTER — Other Ambulatory Visit (HOSPITAL_COMMUNITY): Payer: Self-pay | Admitting: Family Medicine

## 2023-02-11 DIAGNOSIS — N6310 Unspecified lump in the right breast, unspecified quadrant: Secondary | ICD-10-CM

## 2023-02-12 DIAGNOSIS — L4050 Arthropathic psoriasis, unspecified: Secondary | ICD-10-CM | POA: Diagnosis not present

## 2023-03-25 ENCOUNTER — Ambulatory Visit (HOSPITAL_COMMUNITY)
Admission: RE | Admit: 2023-03-25 | Discharge: 2023-03-25 | Disposition: A | Discharge status: Home / Self Care | Payer: 59 | Source: Ambulatory Visit | Attending: Family Medicine | Admitting: Family Medicine

## 2023-03-25 ENCOUNTER — Encounter (HOSPITAL_COMMUNITY): Payer: Self-pay

## 2023-03-25 DIAGNOSIS — N6310 Unspecified lump in the right breast, unspecified quadrant: Secondary | ICD-10-CM | POA: Diagnosis not present

## 2023-03-25 DIAGNOSIS — R92321 Mammographic fibroglandular density, right breast: Secondary | ICD-10-CM | POA: Diagnosis not present

## 2023-03-25 DIAGNOSIS — N6489 Other specified disorders of breast: Secondary | ICD-10-CM | POA: Diagnosis not present

## 2023-04-02 DIAGNOSIS — M533 Sacrococcygeal disorders, not elsewhere classified: Secondary | ICD-10-CM | POA: Diagnosis not present

## 2023-04-02 DIAGNOSIS — M549 Dorsalgia, unspecified: Secondary | ICD-10-CM | POA: Diagnosis not present

## 2023-04-02 DIAGNOSIS — Z79899 Other long term (current) drug therapy: Secondary | ICD-10-CM | POA: Diagnosis not present

## 2023-04-02 DIAGNOSIS — L409 Psoriasis, unspecified: Secondary | ICD-10-CM | POA: Diagnosis not present

## 2023-04-02 DIAGNOSIS — M1990 Unspecified osteoarthritis, unspecified site: Secondary | ICD-10-CM | POA: Diagnosis not present

## 2023-04-02 DIAGNOSIS — L4050 Arthropathic psoriasis, unspecified: Secondary | ICD-10-CM | POA: Diagnosis not present

## 2023-06-03 DIAGNOSIS — M549 Dorsalgia, unspecified: Secondary | ICD-10-CM | POA: Diagnosis not present

## 2023-06-03 DIAGNOSIS — M5136 Other intervertebral disc degeneration, lumbar region: Secondary | ICD-10-CM | POA: Diagnosis not present

## 2023-06-03 DIAGNOSIS — L4050 Arthropathic psoriasis, unspecified: Secondary | ICD-10-CM | POA: Diagnosis not present

## 2023-06-03 DIAGNOSIS — Z79899 Other long term (current) drug therapy: Secondary | ICD-10-CM | POA: Diagnosis not present

## 2023-06-03 DIAGNOSIS — L409 Psoriasis, unspecified: Secondary | ICD-10-CM | POA: Diagnosis not present

## 2023-06-03 DIAGNOSIS — M1990 Unspecified osteoarthritis, unspecified site: Secondary | ICD-10-CM | POA: Diagnosis not present

## 2023-06-16 DIAGNOSIS — M5416 Radiculopathy, lumbar region: Secondary | ICD-10-CM | POA: Diagnosis not present

## 2023-06-16 DIAGNOSIS — M62552 Muscle wasting and atrophy, not elsewhere classified, left thigh: Secondary | ICD-10-CM | POA: Diagnosis not present

## 2023-06-16 DIAGNOSIS — M2569 Stiffness of other specified joint, not elsewhere classified: Secondary | ICD-10-CM | POA: Diagnosis not present

## 2023-06-16 DIAGNOSIS — M62551 Muscle wasting and atrophy, not elsewhere classified, right thigh: Secondary | ICD-10-CM | POA: Diagnosis not present

## 2023-06-23 DIAGNOSIS — M2569 Stiffness of other specified joint, not elsewhere classified: Secondary | ICD-10-CM | POA: Diagnosis not present

## 2023-06-23 DIAGNOSIS — M62552 Muscle wasting and atrophy, not elsewhere classified, left thigh: Secondary | ICD-10-CM | POA: Diagnosis not present

## 2023-06-23 DIAGNOSIS — M5416 Radiculopathy, lumbar region: Secondary | ICD-10-CM | POA: Diagnosis not present

## 2023-06-23 DIAGNOSIS — M62551 Muscle wasting and atrophy, not elsewhere classified, right thigh: Secondary | ICD-10-CM | POA: Diagnosis not present

## 2023-06-26 ENCOUNTER — Ambulatory Visit
Admission: RE | Admit: 2023-06-26 | Discharge: 2023-06-26 | Disposition: A | Discharge status: Home / Self Care | Payer: 59 | Source: Ambulatory Visit | Attending: Family Medicine | Admitting: Family Medicine

## 2023-06-26 VITALS — BP 117/76 | HR 112 | Temp 98.9°F | Resp 20

## 2023-06-26 DIAGNOSIS — R110 Nausea: Secondary | ICD-10-CM | POA: Diagnosis not present

## 2023-06-26 DIAGNOSIS — R519 Headache, unspecified: Secondary | ICD-10-CM

## 2023-06-26 MED ORDER — ONDANSETRON 4 MG PO TBDP: 4.0000 mg | ORAL_TABLET | Freq: Three times a day (TID) | ORAL | 0 refills | Status: AC | PRN

## 2023-06-26 MED ORDER — DEXAMETHASONE SODIUM PHOSPHATE 10 MG/ML IJ SOLN
10.0000 mg | Freq: Once | INTRAMUSCULAR | Status: AC
  Administered 2023-06-26: 10 mg via INTRAMUSCULAR

## 2023-06-26 NOTE — ED Provider Notes (Signed)
[facility]    CSN: [id number] Arrival date & time: 06/26/23  1442      History   Chief Complaint Chief Complaint  Patient presents with   Headache    Entered by patient    HPI IDIL GILLOCK is a 47 y.o. female.   Presenting today with about a week and a half of diffuse headache.  She states she gets menstrual headaches around her menstrual cycle but usually only last 3 days or so.  This 1 started at the onset of her menstrual cycle on 06/14/2023 and has lingered.  Slight improvement in symptoms today but still having significant nausea, pain  Denies head injury, dizziness, visual change, mental status change, vomiting.  Has been trying Tylenol with mild temporary benefit.    Past Medical History:  Diagnosis Date   Abnormal uterine bleeding (AUB) 01/26/2015   Arthritis    psoriatic   Complication of anesthesia    Fatigue 01/26/2015   Gestational diabetes    History of PCOS 01/26/2015   LLQ pain 01/26/2015   Obesity    PCOS (polycystic ovarian syndrome)    Psoriasis    Spinal headache    both deliveries    Patient Active Problem List   Diagnosis Date Noted   Elevated TSH 12/18/2021   Morbid obesity (HCC) 11/08/2021   Osteoarthritis 11/08/2021   Other long term (current) drug therapy 11/08/2021   Psoriatic arthritis (HCC) 11/08/2021   History of PCOS 01/26/2015    Past Surgical History:  Procedure Laterality Date   CARPAL TUNNEL RELEASE Right 2000   CESAREAN SECTION  [id number]   CESAREAN SECTION WITH BILATERAL TUBAL LIGATION Bilateral 12/30/2017   Procedure: REPEAT CESAREAN SECTION WITH BILATERAL TUBAL LIGATION;  Surgeon: Tilda Burrow, MD;  Location: [facility]TH[facility] SUIT[facility];  Service: Obstetrics;  Laterality: Bilateral;   endometrial biopsy     LAPAROSCOPY FOR ECTOPIC PREGNANCY  1997   lumph node removed  2007   TONSILLECTOMY      OB History     Gravida  5   Para  3   Term  3   Preterm      AB  2   Living  3      SAB  1   IAB       Ectopic  1   Multiple      Live Births  3            Home Medications    Prior to Admission medications   Medication Sig Start Date End Date Taking? Authorizing Provider  ondansetron (ZOFRAN-ODT) 4 MG disintegrating tablet Take 1 tablet (4 mg total) by mouth every 8 (eight) hours as needed for nausea or vomiting. 06/26/23  Yes Particia Nearing, PA-C  clobetasol cream (TEMOVATE) 0.05 % Apply topically 2 (two) times daily. 07/18/21   [provider]  Semaglutide-Weight Management 0.25 MG/0.5ML SOAJ Inject 0.25 mg into the skin once a week. 12/18/21   Tommie Sams, DO  ustekinumab (STELARA) 90 MG/ML SOSY injection See admin instructions.    [provider]    Family History Family History  Problem Relation Age of Onset   Hypothyroidism Mother    Diabetes Father    Heart disease Father    Other Father        "something wrong with bones"   Other Maternal Grandfather        brain tumor   Cancer Paternal Grandmother        liver  Epilepsy Son    Narcolepsy Son     Social History Social History   Tobacco Use   Smoking status: Never   Smokeless tobacco: Never  Substance Use Topics   Alcohol use: No   Drug use: No     Allergies   Penicillins, Adalimumab, Leflunomide, Penicillin g, Plaquenil [hydroxychloroquine], Septra [sulfamethoxazole-trimethoprim], Sulfamethoxazole, and Trimethoprim   Review of Systems Review of Systems PER HPI  Physical Exam Triage Vital Signs ED Triage Vitals  Encounter Vitals Group     BP 06/26/23 1452 117/76     Systolic BP Percentile --      Diastolic BP Percentile --      Pulse Rate 06/26/23 1452 (!) 112     Resp 06/26/23 1452 20     Temp 06/26/23 1452 98.9 F (37.2 C)     Temp Source 06/26/23 1452 Oral     SpO2 06/26/23 1452 92 %     Weight --      Height --      Head Circumference --      Peak Flow --      Pain Score 06/26/23 1453 7     Pain Loc --      Pain Education --      Exclude from Growth  Chart --    No data found.  Updated Vital Signs BP 117/76 (BP Location: Right Arm)   Pulse (!) 112   Temp 98.9 F (37.2 C) (Oral)   Resp 20   LMP 06/14/2023   SpO2 92%   Visual Acuity Right Eye Distance:   Left Eye Distance:   Bilateral Distance:    Right Eye Near:   Left Eye Near:    Bilateral Near:     Physical Exam Vitals and nursing note reviewed.  Constitutional:      Appearance: Normal appearance. She is not ill-appearing.  HENT:     Head: Atraumatic.     Mouth/Throat:     Mouth: Mucous membranes are moist.  Eyes:     Extraocular Movements: Extraocular movements intact.     Conjunctiva/sclera: Conjunctivae normal.     Pupils: Pupils are equal, round, and reactive to light.  Cardiovascular:     Rate and Rhythm: Normal rate and regular rhythm.     Heart sounds: Normal heart sounds.  Pulmonary:     Effort: Pulmonary effort is normal.     Breath sounds: Normal breath sounds.  Musculoskeletal:        General: Normal range of motion.     Cervical back: Normal range of motion and neck supple.  Skin:    General: Skin is warm and dry.  Neurological:     Mental Status: She is alert and oriented to person, place, and time.     Cranial Nerves: No cranial nerve deficit.     Motor: No weakness.     Gait: Gait normal.  Psychiatric:        Mood and Affect: Mood normal.        Thought Content: Thought content normal.        Judgment: Judgment normal.    UC Treatments / Results  Labs (all labs ordered are listed, but only abnormal results are displayed) Labs Reviewed - No data to display  EKG  Radiology No results found.  Procedures Procedures (including critical care time)  Medications Ordered in UC Medications  dexamethasone (DECADRON) injection 10 mg (10 mg Intramuscular Given 06/26/23 1520)   Initial Impression / Assessment and Plan /  UC Course  I have reviewed the triage vital signs and the nursing notes.  Pertinent labs & imaging results that were  available during my care of the patient were reviewed by me and considered in my medical decision making (see chart for details).     Vital signs and exam reassuring today, mildly tachycardic in triage otherwise within normal limits.  Will treat with IM Decadron, Zofran, supportive over-the-counter medications and home care.  Return for worsening symptoms.  Final Clinical Impressions(s) / UC Diagnoses   Final diagnoses:  Acute nonintractable headache, unspecified headache type  Nausea without vomiting   Discharge Instructions   None    ED Prescriptions     Medication Sig Dispense Auth. Provider   ondansetron (ZOFRAN-ODT) 4 MG disintegrating tablet Take 1 tablet (4 mg total) by mouth every 8 (eight) hours as needed for nausea or vomiting. 20 tablet Particia Nearing, [place]      PDMP not reviewed this encounter.   Particia Nearing, [place] 06/26/23 1929

## 2023-06-26 NOTE — ED Triage Notes (Signed)
Pt reports she has a headache x 9 days. Starts at the back of her head and goes to the top of her head.  States it has worsened since onset. States she gets a bad headache every time her MP.   Took tylenol   Reports blurry vision periodically

## 2023-07-01 ENCOUNTER — Ambulatory Visit: Payer: 59 | Admitting: Nurse Practitioner

## 2023-07-01 ENCOUNTER — Encounter: Payer: Self-pay | Admitting: Nurse Practitioner

## 2023-07-01 VITALS — BP 142/90 | HR 91 | Temp 97.9°F | Ht 62.0 in | Wt 226.6 lb

## 2023-07-01 DIAGNOSIS — G43019 Migraine without aura, intractable, without status migrainosus: Secondary | ICD-10-CM | POA: Diagnosis not present

## 2023-07-01 DIAGNOSIS — N920 Excessive and frequent menstruation with regular cycle: Secondary | ICD-10-CM

## 2023-07-01 DIAGNOSIS — M62838 Other muscle spasm: Secondary | ICD-10-CM

## 2023-07-01 MED ORDER — RIZATRIPTAN BENZOATE 10 MG PO TBDP: ORAL_TABLET | ORAL | 0 refills | Status: AC

## 2023-07-01 MED ORDER — TOPIRAMATE 50 MG PO TABS: ORAL_TABLET | ORAL | 0 refills | Status: DC

## 2023-07-01 NOTE — Progress Notes (Addendum)
Subjective:     Patient ID: Emily Love, female   DOB: Mar 12, 1976, 47 y.o.   MRN: [id number]  HPI Pt come sin today for a follow up from urgent care on 08/15 for headaches that began on 08/06 and been consistent since. PT describes the headaches as a pounding type of pain. Has had a long-term history of menstrual migraines for years.  Currently having regular cycles with extremely heavy flow for 9 days and then 3 days of light flow.  This occurs once a month.  This headache began on 8/6 after her cycle began on 8/3.  States the headache has been prolonged and still having headache as of today.  Was seen in ED on 06/26/2023, given dexamethasone.  Headache improved temporarily but did not resolve.  Describes as a pounding pain that begins on the right posterior neck area.  Will turn her head and this triggers the headache which is pounding starts right neck right parietal area then will become generalized.  No aura.  No complaints of neck pain.  Headache is fairly constant.  Will wake her up occasionally at nighttime.  Slight blurred vision with "halos" at times.  Photosensitivity and phonophobia.  Nausea but no vomiting.  Has had an eye exam within the past month, states she got new glasses but otherwise normal.  Slight temporary relief with Advil and Tylenol, also uses Vicks vapor rub on the face and neck which can help at times.  Has had a tubal ligation for birth control.  Non-smoker.  Other than her cycle has not identified any specific triggers for the headache.  Review of Systems  HENT:  Negative for congestion, ear pain, sinus pressure, sore throat and trouble swallowing.   Respiratory:  Negative for cough, chest tightness and shortness of breath.   Cardiovascular:  Negative for chest pain.  Neurological:  Positive for headaches. Negative for syncope, facial asymmetry, speech difficulty, weakness and numbness.       Objective:   Physical Exam NAD.  Alert, oriented.  Calm affect.   Symmetrical movement of the face.  Speech clear.  Pupils equal and reactive to light.  TMs normal limit.  Pharynx clear.  Neck supple with minimal adenopathy.  Distinct tight tender muscles noted at the right cervical area into the occipital area.  Normal active ROM of the neck without tenderness.  Tight tender muscles along the right lateral neck area into the trapezius.  Thyroid nontender to palpation, no mass or goiter noted.  Lungs clear.  Heart regular rate rhythm.  Hand strength 5+ bilaterally. Today's Vitals   07/01/23 1108  BP: (!) 142/90  Pulse: 91  Temp: 97.9 F (36.6 C)  SpO2: 98%  Weight: 226 lb 9.6 oz (102.8 kg)  Height: 5\' 2"  (1.575 m)   Body mass index is 41.45 kg/m.     Assessment:     Problem List Items Addressed This Visit       Cardiovascular and Mediastinum   Intractable migraine without aura and without status migrainosus - Primary   Relevant Medications   topiramate (TOPAMAX) 50 MG tablet   rizatriptan (MAXALT-MLT) 10 MG disintegrating tablet   Other Relevant Orders   CBC with Differential   TSH     Other   Menorrhagia with regular cycle   Relevant Orders   CBC with Differential   TSH       Plan:     Problem List Items Addressed This Visit       Cardiovascular and  Mediastinum   Intractable migraine without aura and without status migrainosus - Primary   Relevant Medications   topiramate (TOPAMAX) 50 MG tablet   rizatriptan (MAXALT-MLT) 10 MG disintegrating tablet   Other Relevant Orders   CBC with Differential   TSH     Other   Menorrhagia with regular cycle   Relevant Orders   CBC with Differential   TSH   Other Visit Diagnoses     Muscle spasms of neck           Trial of topiramate 50 mg at bedtime, start with half tab for a few days.  Reviewed potential adverse effects.  Note the patient has had tubal ligation for birth control. Maxalt 10 mg as directed at onset of migraine. Patient to schedule appointment with gynecology as  soon as possible to discuss options regarding menorrhagia. Lab work pending.   Ice/heat applications to the neck area.  Recommend massage therapy.  Stretching exercises. Hold on MRI at this point unless symptoms worsen. Recommend that she keep a migraine headache diary using a free app. Warning signs reviewed regarding headache.  Call or go to ED if worsening or new symptoms develop. Return in about 1 month (around 08/01/2023).

## 2023-07-02 LAB — CBC WITH DIFFERENTIAL/PLATELET
Basophils Absolute: 0.1 10*3/uL (ref 0.0–0.2)
Basos: 1 %
EOS (ABSOLUTE): 0.3 10*3/uL (ref 0.0–0.4)
Eos: 3 %
Hematocrit: 45.9 % (ref 34.0–46.6)
Hemoglobin: 15.4 g/dL (ref 11.1–15.9)
Immature Grans (Abs): 0.1 10*3/uL (ref 0.0–0.1)
Immature Granulocytes: 1 %
Lymphocytes Absolute: 3.6 10*3/uL — ABNORMAL HIGH (ref 0.7–3.1)
Lymphs: 35 %
MCH: 30 pg (ref 26.6–33.0)
MCHC: 33.6 g/dL (ref 31.5–35.7)
MCV: 90 fL (ref 79–97)
Monocytes Absolute: 0.7 10*3/uL (ref 0.1–0.9)
Monocytes: 7 %
Neutrophils Absolute: 5.5 10*3/uL (ref 1.4–7.0)
Neutrophils: 53 %
Platelets: 287 10*3/uL (ref 150–450)
RBC: 5.13 x10E6/uL (ref 3.77–5.28)
RDW: 12.8 % (ref 11.7–15.4)
WBC: 10.3 10*3/uL (ref 3.4–10.8)

## 2023-07-02 LAB — TSH: TSH: 2.2 u[IU]/mL (ref 0.450–4.500)

## 2023-07-25 ENCOUNTER — Other Ambulatory Visit: Payer: Self-pay | Admitting: Nurse Practitioner

## 2023-08-01 ENCOUNTER — Ambulatory Visit: Payer: 59 | Admitting: Family Medicine

## 2023-08-06 ENCOUNTER — Ambulatory Visit: Payer: 59 | Admitting: Family Medicine

## 2023-08-06 VITALS — BP 126/80 | HR 102 | Ht 62.0 in | Wt 222.2 lb

## 2023-08-06 DIAGNOSIS — Z23 Encounter for immunization: Secondary | ICD-10-CM

## 2023-08-06 DIAGNOSIS — G43909 Migraine, unspecified, not intractable, without status migrainosus: Secondary | ICD-10-CM

## 2023-08-06 MED ORDER — NURTEC 75 MG PO TBDP: 75.0000 mg | ORAL_TABLET | ORAL | 1 refills | Status: DC

## 2023-08-06 NOTE — Patient Instructions (Signed)
Medication sent it.  We will see what the insurance says.

## 2023-08-10 DIAGNOSIS — G43909 Migraine, unspecified, not intractable, without status migrainosus: Secondary | ICD-10-CM | POA: Insufficient documentation

## 2023-08-10 NOTE — Assessment & Plan Note (Signed)
Sending in Nurtec.  Hopefully we can get this approved and she can discontinue Topamax.

## 2023-08-10 NOTE — Progress Notes (Signed)
Subjective:  Patient ID: Emily Love, female    DOB: Sep 09, 1976  Age: 47 y.o. MRN: [id number]  CC: Follow up regarding Migraines   HPI:  47 year old female presents for follow-up.  Patient recently started on Topamax for migraines.  She states that this has improved and her migraines have become less frequent.  However, she states that seems to be affecting her mood in patients with her children.  She would like to discuss whether she should continue this medication.  Will discuss other treatment options as well.  Patient Active Problem List   Diagnosis Date Noted   Migraine 08/10/2023   Menorrhagia with regular cycle 07/01/2023   Elevated TSH 12/18/2021   Morbid obesity (HCC) 11/08/2021   Osteoarthritis 11/08/2021   Other long term (current) drug therapy 11/08/2021   Psoriatic arthritis (HCC) 11/08/2021   History of PCOS 01/26/2015    Social Hx   Social History   Socioeconomic History   Marital status: Married    Spouse name: Not on file   Number of children: Not on file   Years of education: Not on file   Highest education level: Not on file  Occupational History   Not on file  Tobacco Use   Smoking status: Never   Smokeless tobacco: Never  Vaping Use   Vaping status: Never Used  Substance and Sexual Activity   Alcohol use: No   Drug use: No   Sexual activity: Yes    Birth control/protection: Surgical    Comment: tubal  Other Topics Concern   Not on file  Social History Narrative   Not on file   Social Determinants of Health   Financial Resource Strain: Not on file  Food Insecurity: Not on file  Transportation Needs: Not on file  Physical Activity: Not on file  Stress: Not on file  Social Connections: Not on file    Review of Systems Per HPI  Objective:  BP 126/80   Pulse (!) 102   Ht 5\' 2"  (1.575 m)   Wt 222 lb 3.2 oz (100.8 kg)   SpO2 98%   BMI 40.64 kg/m      08/11/2023   11:19 AM 08/06/2023    3:19 PM 07/01/2023   11:08 AM   BP/Weight  Systolic BP 116 126 142  Diastolic BP 79 80 90  Wt. (Lbs)  222.2 226.6  BMI  40.64 kg/m2 41.45 kg/m2    Physical Exam Vitals and nursing note reviewed.  Constitutional:      Appearance: Normal appearance. She is obese.  HENT:     Head: Normocephalic and atraumatic.  Pulmonary:     Effort: Pulmonary effort is normal. No respiratory distress.  Neurological:     Mental Status: She is alert.  Psychiatric:        Mood and Affect: Mood normal.        Behavior: Behavior normal.     Lab Results  Component Value Date   WBC 10.3 07/01/2023   HGB 15.4 07/01/2023   HCT 45.9 07/01/2023   PLT 287 07/01/2023   GLUCOSE 95 11/08/2021   ALT 15 12/30/2017   AST 21 12/30/2017   NA 137 11/08/2021   K 3.7 11/08/2021   CL 107 11/08/2021   CREATININE 0.87 11/08/2021   BUN 15 11/08/2021   CO2 25 11/08/2021   TSH 2.200 07/01/2023     Assessment & Plan:   Problem List Items Addressed This Visit       Cardiovascular and Mediastinum  Migraine - Primary    Sending in Nurtec.  Hopefully we can get this approved and she can discontinue Topamax.      Relevant Medications   Rimegepant Sulfate (NURTEC) 75 MG TBDP    Meds ordered this encounter  Medications   Rimegepant Sulfate (NURTEC) 75 MG TBDP    Sig: Take 1 tablet (75 mg total) by mouth every other day.    Dispense:  45 tablet    Refill:  1    Follow-up:  Return in about 3 months (around 11/05/2023).  Everlene Other DO [facility]

## 2023-08-11 ENCOUNTER — Ambulatory Visit
Admission: RE | Admit: 2023-08-11 | Discharge: 2023-08-11 | Disposition: A | Discharge status: Home / Self Care | Payer: 59 | Source: Ambulatory Visit | Attending: Nurse Practitioner | Admitting: Nurse Practitioner

## 2023-08-11 VITALS — BP 116/79 | HR 85 | Temp 98.5°F | Resp 18

## 2023-08-11 DIAGNOSIS — J069 Acute upper respiratory infection, unspecified: Secondary | ICD-10-CM | POA: Insufficient documentation

## 2023-08-11 DIAGNOSIS — Z1152 Encounter for screening for COVID-19: Secondary | ICD-10-CM | POA: Insufficient documentation

## 2023-08-11 LAB — POCT INFLUENZA A/B
Influenza A, POC: NEGATIVE
Influenza B, POC: NEGATIVE

## 2023-08-11 MED ORDER — PROMETHAZINE-DM 6.25-15 MG/5ML PO SYRP: 5.0000 mL | ORAL_SOLUTION | Freq: Every evening | ORAL | 0 refills | Status: DC | PRN

## 2023-08-11 MED ORDER — BENZONATATE 100 MG PO CAPS: 100.0000 mg | ORAL_CAPSULE | Freq: Three times a day (TID) | ORAL | 0 refills | Status: DC | PRN

## 2023-08-11 NOTE — ED Triage Notes (Signed)
Fever 101, body aches, chills, headache, sore throat, cough that started last night. Taking tylenol last does at 10:15 am today.

## 2023-08-11 NOTE — Discharge Instructions (Signed)
You have a viral upper respiratory infection.  Symptoms should improve over the next week to 10 days.  If you develop chest pain or shortness of breath, go to the emergency room.  Influenza test today was negative.   We have tested you today for COVID-19.  You will see the results in Mychart and we will contact you with positive results.  Please stay home and isolate until you are fever free without fever reducing medicine for 24 hours.     Some things that can make you feel better are: - Increased rest - Increasing fluid with water/sugar free electrolytes - Acetaminophen and ibuprofen as needed for fever/pain - Salt water gargling, chloraseptic spray and throat lozenges - OTC guaifenesin (Mucinex) 600 mg twice daily - Saline sinus flushes or a neti pot - Humidifying the air -Tessalon Perles every 8 hours as needed for dry cough and cough syrup at night time as needed

## 2023-08-11 NOTE — ED Provider Notes (Signed)
[facility]    CSN: [id number] Arrival date & time: 08/11/23  1052      History   Chief Complaint Chief Complaint  Patient presents with   Fever    Entered by patient   Cough    HPI Emily Love is a 47 y.o. female.   Patient presents 1 day history of fever, bodyaches and chills, dry cough, postnasal drainage and sore throat, runny and stuffy nose, headache, bilateral ear fullness, decreased appetite, decreased smell, and fatigue.  Reports temperature max at home 101 F.  No shortness of breath or chest pain, abdominal pain, nausea/vomiting, diarrhea.  No known sick contacts.  Has taken Tylenol which helps with the fever and bodyaches.  She is understanding over-the-counter medications because some of them make her heart race.    Past Medical History:  Diagnosis Date   Abnormal uterine bleeding (AUB) 01/26/2015   Arthritis    psoriatic   Complication of anesthesia    Fatigue 01/26/2015   Gestational diabetes    History of PCOS 01/26/2015   LLQ pain 01/26/2015   Obesity    PCOS (polycystic ovarian syndrome)    Psoriasis    Spinal headache    both deliveries    Patient Active Problem List   Diagnosis Date Noted   Migraine 08/10/2023   Menorrhagia with regular cycle 07/01/2023   Elevated TSH 12/18/2021   Morbid obesity (HCC) 11/08/2021   Osteoarthritis 11/08/2021   Other long term (current) drug therapy 11/08/2021   Psoriatic arthritis (HCC) 11/08/2021   History of PCOS 01/26/2015    Past Surgical History:  Procedure Laterality Date   CARPAL TUNNEL RELEASE Right 2000   CESAREAN SECTION  [id number]   CESAREAN SECTION WITH BILATERAL TUBAL LIGATION Bilateral 12/30/2017   Procedure: REPEAT CESAREAN SECTION WITH BILATERAL TUBAL LIGATION;  Surgeon: Tilda Burrow, MD;  Location: [facility];  Service: Obstetrics;  Laterality: Bilateral;   endometrial biopsy     LAPAROSCOPY FOR ECTOPIC PREGNANCY  1997   lumph node removed  2007   TONSILLECTOMY       OB History     Gravida  5   Para  3   Term  3   Preterm      AB  2   Living  3      SAB  1   IAB      Ectopic  1   Multiple      Live Births  3            Home Medications    Prior to Admission medications   Medication Sig Start Date End Date Taking? Authorizing Provider  benzonatate (TESSALON) 100 MG capsule Take 1 capsule (100 mg total) by mouth 3 (three) times daily as needed for cough. Do not take with alcohol or while driving or operating heavy machinery.  May cause drowsiness. 08/11/23  Yes Cathlean Marseilles A, NP  clobetasol cream (TEMOVATE) 0.05 % Apply topically 2 (two) times daily. 07/18/21  Yes [provider]  promethazine-dextromethorphan (PROMETHAZINE-DM) 6.25-15 MG/5ML syrup Take 5 mLs by mouth at bedtime as needed for cough. Do not take with alcohol or while driving or operating heavy machinery.  May cause drowsiness. 08/11/23  Yes Cathlean Marseilles A, NP  topiramate (TOPAMAX) 50 MG tablet TAKE 1 TABLET BY MOUTH AT BEDTIME FOR MIGRAINE HEADACHE 07/30/23  Yes Campbell Riches, NP  ondansetron (ZOFRAN-ODT) 4 MG disintegrating tablet Take 1 tablet (4 mg total) by mouth every 8 (  eight) hours as needed for nausea or vomiting. Patient not taking: Reported on 08/06/2023 06/26/23   Particia Nearing, PA-C  Rimegepant Sulfate (NURTEC) 75 MG TBDP Take 1 tablet (75 mg total) by mouth every other day. 08/06/23   Tommie Sams, DO  rizatriptan (MAXALT-MLT) 10 MG disintegrating tablet Take one tab po at onset of migraine. May repeat in 2 hours if needed. Max 2 pills in 24 hours. Patient not taking: Reported on 08/06/2023 07/01/23   Campbell Riches, NP  ustekinumab [facility]) 90 MG/ML SOSY injection See admin instructions.    [provider]    Family History Family History  Problem Relation Age of Onset   Hypothyroidism Mother    Diabetes Father    Heart disease Father    Other Father        "something wrong with bones"   Other  Maternal Grandfather        brain tumor   Cancer Paternal Grandmother        liver   Epilepsy Son    Narcolepsy Son     Social History Social History   Tobacco Use   Smoking status: Never   Smokeless tobacco: Never  Vaping Use   Vaping status: Never Used  Substance Use Topics   Alcohol use: No   Drug use: No     Allergies   Penicillins, Adalimumab, Leflunomide, Penicillin g, Plaquenil [hydroxychloroquine], Septra [sulfamethoxazole-trimethoprim], Sulfamethoxazole, and Trimethoprim   Review of Systems Review of Systems Per HPI  Physical Exam Triage Vital Signs ED Triage Vitals [08/11/23 1119]  Encounter Vitals Group     BP 116/79     Systolic BP Percentile      Diastolic BP Percentile      Pulse Rate 85     Resp 18     Temp 98.5 F (36.9 C)     Temp Source Oral     SpO2 97 %     Weight      Height      Head Circumference      Peak Flow      Pain Score 4     Pain Loc      Pain Education      Exclude from Growth Chart    No data found.  Updated Vital Signs BP 116/79 (BP Location: Right Arm)   Pulse 85   Temp 98.5 F (36.9 C) (Oral)   Resp 18   LMP 08/10/2023 (Exact Date)   SpO2 97%   Visual Acuity Right Eye Distance:   Left Eye Distance:   Bilateral Distance:    Right Eye Near:   Left Eye Near:    Bilateral Near:     Physical Exam Vitals and nursing note reviewed.  Constitutional:      General: She is not in acute distress.    Appearance: Normal appearance. She is not toxic-appearing.  HENT:     Head: Normocephalic and atraumatic.     Right Ear: Tympanic membrane, ear canal and external ear normal.     Left Ear: Tympanic membrane, ear canal and external ear normal.     Nose: Rhinorrhea present. No congestion.     Mouth/Throat:     Mouth: Mucous membranes are moist.     Pharynx: Oropharynx is clear. No oropharyngeal exudate or posterior oropharyngeal erythema.  Eyes:     General: No scleral icterus.    Extraocular Movements:  Extraocular movements intact.  Cardiovascular:     Rate and Rhythm: Normal  rate and regular rhythm.  Pulmonary:     Effort: Pulmonary effort is normal. No respiratory distress.     Breath sounds: Normal breath sounds. No wheezing, rhonchi or rales.  Musculoskeletal:     Cervical back: Normal range of motion and neck supple.  Lymphadenopathy:     Cervical: No cervical adenopathy.  Skin:    General: Skin is warm and dry.     Coloration: Skin is not jaundiced or pale.     Findings: No erythema or rash.  Neurological:     Mental Status: She is alert and oriented to person, place, and time.  Psychiatric:        Behavior: Behavior is cooperative.      UC Treatments / Results  Labs (all labs ordered are listed, but only abnormal results are displayed) Labs Reviewed  SARS CORONAVIRUS 2 (TAT 6-24 HRS)  POCT INFLUENZA A/B    EKG   Radiology No results found.  Procedures Procedures (including critical care time)  Medications Ordered in UC Medications - No data to display  Initial Impression / Assessment and Plan / UC Course  I have reviewed the triage vital signs and the nursing notes.  Pertinent labs & imaging results that were available during my care of the patient were reviewed by me and considered in my medical decision making (see chart for details).   Patient is well-appearing, normotensive, afebrile, not tachycardic, not tachypneic, oxygenating well on room air.    1. Viral URI with cough 2. Encounter for screening for COVID-19 Suspect viral etiology Vitals and exam today are reassuring Influenza test negative COVID-19 test pending Supportive care discussed with patient Recommended use of cough suppressant medication as needed Strict ER return precautions reviewed with patient Work excuse provided  The patient was given the opportunity to ask questions.  All questions answered to their satisfaction.  The patient is in agreement to this plan.    Final Clinical  Impressions(s) / UC Diagnoses   Final diagnoses:  Viral URI with cough  Encounter for screening for COVID-19     Discharge Instructions      You have a viral upper respiratory infection.  Symptoms should improve over the next week to 10 days.  If you develop chest pain or shortness of breath, go to the emergency room.  Influenza test today was negative.   We have tested you today for COVID-19.  You will see the results in Mychart and we will contact you with positive results.  Please stay home and isolate until you are fever free without fever reducing medicine for 24 hours.     Some things that can make you feel better are: - Increased rest - Increasing fluid with water/sugar free electrolytes - Acetaminophen and ibuprofen as needed for fever/pain - Salt water gargling, chloraseptic spray and throat lozenges - OTC guaifenesin (Mucinex) 600 mg twice daily - Saline sinus flushes or a neti pot - Humidifying the air -Tessalon Perles every 8 hours as needed for dry cough and cough syrup at night time as needed      ED Prescriptions     Medication Sig Dispense Auth. Provider   benzonatate (TESSALON) 100 MG capsule Take 1 capsule (100 mg total) by mouth 3 (three) times daily as needed for cough. Do not take with alcohol or while driving or operating heavy machinery.  May cause drowsiness. 21 capsule Cathlean Marseilles A, NP   promethazine-dextromethorphan (PROMETHAZINE-DM) 6.25-15 MG/5ML syrup Take 5 mLs by mouth at bedtime as  needed for cough. Do not take with alcohol or while driving or operating heavy machinery.  May cause drowsiness. 118 mL Valentino Nose, NP      PDMP not reviewed this encounter.   Valentino Nose, NP 08/11/23 1227

## 2023-08-12 LAB — SARS CORONAVIRUS 2 (TAT 6-24 HRS): SARS Coronavirus 2: NEGATIVE

## 2023-10-28 ENCOUNTER — Other Ambulatory Visit: Payer: Self-pay | Admitting: Nurse Practitioner

## 2023-10-29 ENCOUNTER — Ambulatory Visit: Payer: 59 | Admitting: Family Medicine

## 2024-01-26 ENCOUNTER — Other Ambulatory Visit: Payer: Self-pay | Admitting: Nurse Practitioner

## 2024-02-04 ENCOUNTER — Other Ambulatory Visit: Payer: Self-pay | Admitting: Family Medicine

## 2024-02-04 ENCOUNTER — Ambulatory Visit: Payer: Self-pay | Admitting: *Deleted

## 2024-02-04 MED ORDER — PROMETHAZINE-DM 6.25-15 MG/5ML PO SYRP: 5.0000 mL | ORAL_SOLUTION | Freq: Four times a day (QID) | ORAL | 0 refills | Status: DC | PRN

## 2024-02-04 NOTE — Telephone Encounter (Signed)
  Chief Complaint: C/o dry hacking cough every other breath.   Requesting cough medicine rx be called in for her.    She started coughing and not feeling well at work yesterday.   Fever 101-103.  Nurse at work tested her for covid and flu.    Positive for flu A so Tamiflu started yesterday.   She has tried Robitussin without relief at all.   Also Promethazine tried.  "I am coughing every other breath".    C/o rib pain from coughing so much.   Symptoms: Dry, hacking cough every other breath Frequency: I can't sleep I'm coughing so much and my ribs hurt. Pertinent Negatives: Patient denies coughing up anything.   Disposition: [] ED /[] Urgent Care (no appt availability in office) / [] Appointment(In office/virtual)/ []  [place] [facility]/ [] Home Care/ [] Refused Recommended Disposition /[] [place] [facility]/ [x]  Follow-up with PCP Additional Notes: Pt asking if Dr. Adriana Simas would be willing to call in some cough medicine for her to the [facility] in [place].    She is also asking if her husband and son start having symptoms would Dr. Adriana Simas call in Tamiflu for them?   They both see Dr. Adriana Simas also.   Neither of them are having symptoms right now.    Message sent to Dr. Everlene Other, DO

## 2024-02-04 NOTE — Telephone Encounter (Signed)
 Message from [place] P sent at 02/04/2024 10:52 AM EDT  Copied From CRM [id number]. Reason for Triage: Pt has a really dry cough, tested positive for Flu A and tamiflu is not working. Coughing so hard that ribs hurts. [phone]    Call History  Contact Date/Time Type Contact Phone/Fax By  02/04/2024 10:49 AM EDT Phone (Incoming) [place]ford Peers, [place]   Reason for Disposition  [1] Continuous (nonstop) coughing interferes with work or school AND [2] no improvement using cough treatment per Care Advice    Saw nurse at work and tested positive for Flu A 3/25.   Tamiflu started too.   C/o dry hacking cough every other breath.  Requesting cough medication.  Answer Assessment - Initial Assessment Questions 1. ONSET: "When did the cough begin?"      It's a dry hacking cough every other breath.    Promethazine is not helping.  My ribs hurt so bad. I have the flu.   Diagnosed yesterday with the flu and given Tamiflu.   I saw the nurse at work and flu A was positive.     2. SEVERITY: "How bad is the cough today?"      Real bad   just a bad hacking cough 3. SPUTUM: "Describe the color of your sputum" (none, dry cough; clear, white, yellow, green)     No  4. HEMOPTYSIS: "Are you coughing up any blood?" If so ask: "How much?" (flecks, streaks, tablespoons, etc.)     Not asked 5. DIFFICULTY BREATHING: "Are you having difficulty breathing?" If Yes, ask: "How bad is it?" (e.g., mild, moderate, severe)    - MILD: No SOB at rest, mild SOB with walking, speaks normally in sentences, can lie down, no retractions, pulse < 100.    - MODERATE: SOB at rest, SOB with minimal exertion and prefers to sit, cannot lie down flat, speaks in phrases, mild retractions, audible wheezing, pulse 100-120.    - SEVERE: Very SOB at rest, speaks in single words, struggling to breathe, sitting hunched forward, retractions, pulse > 120      No just coughing so much 6. FEVER: "Do you have a fever?" If Yes, ask: "What is  your temperature, how was it measured, and when did it start?"     101-103  7. CARDIAC HISTORY: "Do you have any history of heart disease?" (e.g., heart attack, congestive heart failure)      Not asked 8. LUNG HISTORY: "Do you have any history of lung disease?"  (e.g., pulmonary embolus, asthma, emphysema)     Not asked 9. PE RISK FACTORS: "Do you have a history of blood clots?" (or: recent major surgery, recent prolonged travel, bedridden)     Not asked 10. OTHER SYMPTOMS: "Do you have any other symptoms?" (e.g., runny nose, wheezing, chest pain)       Flu symptoms, mostly just coughing and not feeling well.  The nurse did a flu test and a covid test yesterday at work and I'm positive for Flu A.   So Tamiflu started. 11. PREGNANCY: "Is there any chance you are pregnant?" "When was your last menstrual period?"       Not asked due to age 48. TRAVEL: "Have you traveled out of the country in the last month?" (e.g., travel history, exposures)       N/A  Protocols used: Cough - Acute Non-Productive-A-AH

## 2024-02-05 NOTE — Telephone Encounter (Signed)
 Patient notified

## 2024-02-05 NOTE — Telephone Encounter (Signed)
 Emily Sams, DO     I sent in cough medication. I am happy to send in Tamiflu for the family if they develop symptoms.

## 2024-02-06 ENCOUNTER — Ambulatory Visit (INDEPENDENT_AMBULATORY_CARE_PROVIDER_SITE_OTHER)

## 2024-02-06 ENCOUNTER — Ambulatory Visit
Admission: RE | Admit: 2024-02-06 | Discharge: 2024-02-06 | Disposition: A | Discharge status: Home / Self Care | Source: Ambulatory Visit | Attending: Nurse Practitioner | Admitting: Nurse Practitioner

## 2024-02-06 ENCOUNTER — Other Ambulatory Visit: Payer: Self-pay

## 2024-02-06 VITALS — BP 112/83 | HR 83 | Temp 98.8°F | Resp 20

## 2024-02-06 DIAGNOSIS — R109 Unspecified abdominal pain: Secondary | ICD-10-CM | POA: Insufficient documentation

## 2024-02-06 DIAGNOSIS — R509 Fever, unspecified: Secondary | ICD-10-CM | POA: Diagnosis present

## 2024-02-06 DIAGNOSIS — Z8709 Personal history of other diseases of the respiratory system: Secondary | ICD-10-CM | POA: Diagnosis not present

## 2024-02-06 DIAGNOSIS — J111 Influenza due to unidentified influenza virus with other respiratory manifestations: Secondary | ICD-10-CM | POA: Diagnosis present

## 2024-02-06 LAB — POCT URINALYSIS DIP (MANUAL ENTRY)
Bilirubin, UA: NEGATIVE
Glucose, UA: NEGATIVE mg/dL
Ketones, POC UA: NEGATIVE mg/dL
Leukocytes, UA: NEGATIVE
Nitrite, UA: NEGATIVE
Protein Ur, POC: NEGATIVE mg/dL
Spec Grav, UA: <=1.005 — AB (ref 1.010–1.025)
Urobilinogen, UA: 0.2 U/dL
pH, UA: 5.5 (ref 5.0–8.0)

## 2024-02-06 MED ORDER — KETOROLAC TROMETHAMINE 30 MG/ML IJ SOLN
30.0000 mg | Freq: Once | INTRAMUSCULAR | Status: AC
  Administered 2024-02-06: 30 mg via INTRAMUSCULAR

## 2024-02-06 NOTE — Discharge Instructions (Addendum)
 You were given an injection of Toradol 30 mg today.  Do not take any additional NSAIDs.  Continue taking Tylenol for pain, fever, or general discomfort. X-ray of your abdomen is pending.  You will be contacted if the pending test results are abnormal.  A urine culture has also been ordered.  If the results of the culture are positive, you will be contacted as well.  You also access to your results via MyChart. You may continue over-the-counter Tylenol as needed for pain, fever, or general discomfort. Continue to drink plenty fluids and allow for plenty of rest. You may take over-the-counter cough medication such as Delsym as needed for your cough. As discussed, if your symptoms continue to persist, please follow-up with your PCP within the next 5 to 7 days for reevaluation. Follow-up as needed.

## 2024-02-06 NOTE — ED Triage Notes (Signed)
 Pt reports bilateral flank pain/tenderness that radiates around to bilateral ribs.pt tested positive for flu earlier this week. Last dose of tylenol this am and reports is currently taking tamiflu.

## 2024-02-06 NOTE — ED Provider Notes (Signed)
 [facility]    CSN: [id number] Arrival date & time: 02/06/24  1007      History   Chief Complaint Chief Complaint  Patient presents with   Flank Pain    HPI Emily Love is a 48 y.o. female.   The history is provided by the patient.   Patient presents for complaints of bilateral flank pain and tenderness that started over the past several days.  Patient states that she was diagnosed with the flu earlier this week.  States that the pain radiates around to her rib cage.  She reports that she has had fever around the time that she was diagnosed.  She denies wheezing, difficulty breathing, urinary frequency, urgency, dysuria, hematuria, or low back pain.  Patient states that she does not have a history of kidney stones.  States she has been taking Tylenol and Tamiflu.  Past Medical History:  Diagnosis Date   Abnormal uterine bleeding (AUB) 01/26/2015   Arthritis    psoriatic   Complication of anesthesia    Fatigue 01/26/2015   Gestational diabetes    History of PCOS 01/26/2015   LLQ pain 01/26/2015   Obesity    PCOS (polycystic ovarian syndrome)    Psoriasis    Spinal headache    both deliveries    Patient Active Problem List   Diagnosis Date Noted   Migraine 08/10/2023   Menorrhagia with regular cycle 07/01/2023   Elevated TSH 12/18/2021   Morbid obesity (HCC) 11/08/2021   Osteoarthritis 11/08/2021   Other long term (current) drug therapy 11/08/2021   Psoriatic arthritis (HCC) 11/08/2021   History of PCOS 01/26/2015    Past Surgical History:  Procedure Laterality Date   CARPAL TUNNEL RELEASE Right 2000   CESAREAN SECTION  4098,1191   CESAREAN SECTION WITH BILATERAL TUBAL LIGATION Bilateral 12/30/2017   Procedure: REPEAT CESAREAN SECTION WITH BILATERAL TUBAL LIGATION;  Surgeon: Tilda Burrow, MD;  Location: [facility];  Service: Obstetrics;  Laterality: Bilateral;   endometrial biopsy     LAPAROSCOPY FOR ECTOPIC PREGNANCY  1997   lumph  node removed  2007   TONSILLECTOMY      OB History     Gravida  5   Para  3   Term  3   Preterm      AB  2   Living  3      SAB  1   IAB      Ectopic  1   Multiple      Live Births  3            Home Medications    Prior to Admission medications   Medication Sig Start Date End Date Taking? Authorizing Provider  oseltamivir (TAMIFLU) 75 MG capsule Take 75 mg by mouth 2 (two) times daily. 02/03/24  Yes [provider]  benzonatate (TESSALON) 100 MG capsule Take 1 capsule (100 mg total) by mouth 3 (three) times daily as needed for cough. Do not take with alcohol or while driving or operating heavy machinery.  May cause drowsiness. 08/11/23   Valentino Nose, NP  clobetasol cream (TEMOVATE) 0.05 % Apply topically 2 (two) times daily. 07/18/21   [provider]  ondansetron (ZOFRAN-ODT) 4 MG disintegrating tablet Take 1 tablet (4 mg total) by mouth every 8 (eight) hours as needed for nausea or vomiting. Patient not taking: Reported on 08/06/2023 06/26/23   Particia Nearing, PA-C  promethazine-dextromethorphan (PROMETHAZINE-DM) 6.25-15 MG/5ML syrup Take 5 mLs by  mouth at bedtime as needed for cough. Do not take with alcohol or while driving or operating heavy machinery.  May cause drowsiness. 08/11/23   Valentino Nose, NP  promethazine-dextromethorphan (PROMETHAZINE-DM) 6.25-15 MG/5ML syrup Take 5 mLs by mouth 4 (four) times daily as needed for cough. 02/04/24   Tommie Sams, DO  Rimegepant Sulfate (NURTEC) 75 MG TBDP Take 1 tablet (75 mg total) by mouth every other day. 08/06/23   Tommie Sams, DO  rizatriptan (MAXALT-MLT) 10 MG disintegrating tablet Take one tab po at onset of migraine. May repeat in 2 hours if needed. Max 2 pills in 24 hours. Patient not taking: Reported on 08/06/2023 07/01/23   Campbell Riches, NP  topiramate (TOPAMAX) 50 MG tablet TAKE 1 TABLET BY MOUTH AT BEDTIME FOR MIGRAINE HEADACHE 01/26/24   Everlene Other G, DO   ustekinumab [facility]) 90 MG/ML SOSY injection See admin instructions.    [provider]    Family History Family History  Problem Relation Age of Onset   Hypothyroidism Mother    Diabetes Father    Heart disease Father    Other Father        "something wrong with bones"   Other Maternal Grandfather        brain tumor   Cancer Paternal Grandmother        liver   Epilepsy Son    Narcolepsy Son     Social History Social History   Tobacco Use   Smoking status: Never   Smokeless tobacco: Never  Vaping Use   Vaping status: Never Used  Substance Use Topics   Alcohol use: No   Drug use: No     Allergies   Penicillins, Adalimumab, Leflunomide, Penicillin g, Plaquenil [hydroxychloroquine], Septra [sulfamethoxazole-trimethoprim], Sulfamethoxazole, and Trimethoprim   Review of Systems Review of Systems Per HPI  Physical Exam Triage Vital Signs ED Triage Vitals  Encounter Vitals Group     BP 02/06/24 1025 112/83     Systolic BP Percentile --      Diastolic BP Percentile --      Pulse Rate 02/06/24 1025 83     Resp 02/06/24 1025 20     Temp 02/06/24 1025 98.8 F (37.1 C)     Temp Source 02/06/24 1025 Oral     SpO2 02/06/24 1025 97 %     Weight --      Height --      Head Circumference --      Peak Flow --      Pain Score 02/06/24 1028 6     Pain Loc --      Pain Education --      Exclude from Growth Chart --    No data found.  Updated Vital Signs BP 112/83 (BP Location: Right Arm)   Pulse 83   Temp 98.8 F (37.1 C) (Oral)   Resp 20   LMP 01/28/2024 (Approximate)   SpO2 97%   Visual Acuity Right Eye Distance:   Left Eye Distance:   Bilateral Distance:    Right Eye Near:   Left Eye Near:    Bilateral Near:     Physical Exam Vitals and nursing note reviewed.  Constitutional:      General: She is not in acute distress.    Appearance: Normal appearance.  HENT:     Head: Normocephalic.  Eyes:     Extraocular Movements: Extraocular  movements intact.     Conjunctiva/sclera: Conjunctivae normal.  Pupils: Pupils are equal, round, and reactive to light.  Cardiovascular:     Rate and Rhythm: Normal rate and regular rhythm.     Pulses: Normal pulses.     Heart sounds: Normal heart sounds.  Pulmonary:     Effort: Pulmonary effort is normal. No respiratory distress.     Breath sounds: Normal breath sounds. No stridor. No wheezing, rhonchi or rales.  Abdominal:     General: Bowel sounds are normal.     Palpations: Abdomen is soft.     Tenderness: There is no abdominal tenderness. There is right CVA tenderness and left CVA tenderness.  Musculoskeletal:     Cervical back: Normal range of motion.  Lymphadenopathy:     Cervical: No cervical adenopathy.  Skin:    General: Skin is warm and dry.  Neurological:     General: No focal deficit present.     Mental Status: She is alert and oriented to person, place, and time.  Psychiatric:        Mood and Affect: Mood normal.        Behavior: Behavior normal.      UC Treatments / Results  Labs (all labs ordered are listed, but only abnormal results are displayed) Labs Reviewed  POCT URINALYSIS DIP (MANUAL ENTRY) - Abnormal; Notable for the following components:      Result Value   Color, UA light yellow (*)    Spec Grav, UA <=1.005 (*)    Blood, UA trace-lysed (*)    All other components within normal limits  URINE CULTURE    EKG   Radiology DG Abdomen 1 View Result Date: 02/06/2024 CLINICAL DATA:  Bilateral flank pain. EXAM: ABDOMEN - 1 VIEW COMPARISON:  None Available. FINDINGS: The bowel gas pattern is normal. No radio-opaque calculi or other significant radiographic abnormality are seen. No acute osseous abnormality. Prior tubal ligation. IMPRESSION: Negative. Electronically Signed   By: Obie Dredge M.D.   On: 02/06/2024 12:38    Procedures Procedures (including critical care time)  Medications Ordered in UC Medications  ketorolac (TORADOL) 30 MG/ML  injection 30 mg (30 mg Intramuscular Given 02/06/24 1053)    Initial Impression / Assessment and Plan / UC Course  I have reviewed the triage vital signs and the nursing notes.  Pertinent labs & imaging results that were available during my care of the patient were reviewed by me and considered in my medical decision making (see chart for details).  Toradol 30 mg IM administered for flank pain.  KUB ordered to rule out kidney stone.  Urinalysis was negative for UTI.  Urine culture is pending.  Difficult to determine if patient's flank pain is due to GU etiology versus her recent diagnosis of influenza.  Supportive care recommendations were provided and discussed with the patient to include fluids, rest, and over-the-counter analgesics.  Discussed indications for follow-up with the patient.  Patient was in agreement with this plan of care and verbalized understanding.  All questions were answered.  Patient stable for discharge.  Work note was provided.  Final Clinical Impressions(s) / UC Diagnoses   Final diagnoses:  Bilateral flank pain     Discharge Instructions      You were given an injection of Toradol 30 mg today.  Do not take any additional NSAIDs.  Continue taking Tylenol for pain, fever, or general discomfort. X-ray of your abdomen is pending.  You will be contacted if the pending test results are abnormal.  A urine culture has also  been ordered.  If the results of the culture are positive, you will be contacted as well.  You also access to your results via MyChart. You may continue over-the-counter Tylenol as needed for pain, fever, or general discomfort. Continue to drink plenty fluids and allow for plenty of rest. You may take over-the-counter cough medication such as Delsym as needed for your cough. As discussed, if your symptoms continue to persist, please follow-up with your PCP within the next 5 to 7 days for reevaluation. Follow-up as needed.     ED Prescriptions    None    PDMP not reviewed this encounter.   Abran Cantor, NP 02/06/24 1336

## 2024-02-08 LAB — URINE CULTURE

## 2024-03-01 ENCOUNTER — Ambulatory Visit
Admission: EM | Admit: 2024-03-01 | Discharge: 2024-03-01 | Disposition: A | Discharge status: Home / Self Care | Attending: Nurse Practitioner | Admitting: Nurse Practitioner

## 2024-03-01 DIAGNOSIS — K1120 Sialoadenitis, unspecified: Secondary | ICD-10-CM | POA: Diagnosis not present

## 2024-03-01 MED ORDER — CLINDAMYCIN HCL 300 MG PO CAPS: 300.0000 mg | ORAL_CAPSULE | Freq: Three times a day (TID) | ORAL | 0 refills | Status: AC

## 2024-03-01 NOTE — ED Triage Notes (Signed)
 Pt reports swollen lymph nodes on both sides of the neck. Lymph nodes on the left have been swollen x 2 weeks and the right x 1 week. Denies sore throat but can feel the swelling when swallowing. Pt states she  recently had the flu, has an autoimmune disease.

## 2024-03-01 NOTE — Discharge Instructions (Addendum)
 Take medication as prescribed.  Recommend that you take a probiotic or eat yogurt daily while taking the current medication. You may continue over-the-counter Tylenol  or ibuprofen  as needed for pain, fever, or general discomfort. Continue use of warm compresses to the affected area to help with pain or discomfort. You may suck on a piece of sour candies such as lemon candy to help with swelling in the left side of your face. As discussed, please follow-up with your primary care physician for reevaluation. Follow-up as needed.

## 2024-03-01 NOTE — ED Provider Notes (Signed)
 [facility]    CSN: [id number] Arrival date & time: 03/01/24  0102      History   Chief Complaint No chief complaint on file.   HPI Emily Love is a 48 y.o. female.   The history is provided by the patient.   Patient presents for complaints of lymph node swelling that has been present for the past 1 to 2 weeks.  Patient states that symptoms started on the left side of her neck and have since progressed to the right side.  She denies throat pain, but states that she can feel her lymph nodes when she is swallowing.  She denies fever, chills, headache, nasal congestion, runny nose, or cough.  She reports that she does have underlying history of psoriatic arthritis.  She denies any obvious known sick contacts.  States she has recently had the flu.  Past Medical History:  Diagnosis Date   Abnormal uterine bleeding (AUB) 01/26/2015   Arthritis    psoriatic   Complication of anesthesia    Fatigue 01/26/2015   Gestational diabetes    History of PCOS 01/26/2015   LLQ pain 01/26/2015   Obesity    PCOS (polycystic ovarian syndrome)    Psoriasis    Spinal headache    both deliveries    Patient Active Problem List   Diagnosis Date Noted   Migraine 08/10/2023   Menorrhagia with regular cycle 07/01/2023   Elevated TSH 12/18/2021   Morbid obesity (HCC) 11/08/2021   Osteoarthritis 11/08/2021   Other long term (current) drug therapy 11/08/2021   Psoriatic arthritis (HCC) 11/08/2021   History of PCOS 01/26/2015    Past Surgical History:  Procedure Laterality Date   CARPAL TUNNEL RELEASE Right 2000   CESAREAN SECTION  [id number]   CESAREAN SECTION WITH BILATERAL TUBAL LIGATION Bilateral 12/30/2017   Procedure: REPEAT CESAREAN SECTION WITH BILATERAL TUBAL LIGATION;  Surgeon: Albino Hum, MD;  Location: [facility]IRTH[facility] SU[facility];  Service: Obstetrics;  Laterality: Bilateral;   endometrial biopsy     LAPAROSCOPY FOR ECTOPIC PREGNANCY  1997   lumph node removed  2007    TONSILLECTOMY      OB History     Gravida  5   Para  3   Term  3   Preterm      AB  2   Living  3      SAB  1   IAB      Ectopic  1   Multiple      Live Births  3            Home Medications    Prior to Admission medications   Medication Sig Start Date End Date Taking? Authorizing Provider  benzonatate  (TESSALON ) 100 MG capsule Take 1 capsule (100 mg total) by mouth 3 (three) times daily as needed for cough. Do not take with alcohol or while driving or operating heavy machinery.  May cause drowsiness. 08/11/23   Wilhemena Harbour, NP  clobetasol cream (TEMOVATE) 0.05 % Apply topically 2 (two) times daily. 07/18/21   [provider]  ondansetron  (ZOFRAN -ODT) 4 MG disintegrating tablet Take 1 tablet (4 mg total) by mouth every 8 (eight) hours as needed for nausea or vomiting. Patient not taking: Reported on 08/06/2023 06/26/23   Corbin Dess, PA-C  oseltamivir (TAMIFLU) 75 MG capsule Take 75 mg by mouth 2 (two) times daily. 02/03/24   [provider]  promethazine -dextromethorphan (PROMETHAZINE -DM) 6.25-15 MG/5ML syrup Take 5 mLs by mouth  at bedtime as needed for cough. Do not take with alcohol or while driving or operating heavy machinery.  May cause drowsiness. 08/11/23   Wilhemena Harbour, NP  promethazine -dextromethorphan (PROMETHAZINE -DM) 6.25-15 MG/5ML syrup Take 5 mLs by mouth 4 (four) times daily as needed for cough. 02/04/24   Cook, Jayce G, DO  Rimegepant Sulfate (NURTEC) 75 MG TBDP Take 1 tablet (75 mg total) by mouth every other day. 08/06/23   Cook, Jayce G, DO  rizatriptan  (MAXALT -MLT) 10 MG disintegrating tablet Take one tab po at onset of migraine. May repeat in 2 hours if needed. Max 2 pills in 24 hours. Patient not taking: Reported on 08/06/2023 07/01/23   Derenda Flax, NP  topiramate  (TOPAMAX ) 50 MG tablet TAKE 1 TABLET BY MOUTH AT BEDTIME FOR MIGRAINE HEADACHE 01/26/24   Cook, Jayce G, DO  ustekinumab  (STELARA ) 90 MG/ML  SOSY injection See admin instructions.    [provider]    Family History Family History  Problem Relation Age of Onset   Hypothyroidism Mother    Diabetes Father    Heart disease Father    Other Father        "something wrong with bones"   Other Maternal Grandfather        brain tumor   Cancer Paternal Grandmother        liver   Epilepsy Son    Narcolepsy Son     Social History Social History   Tobacco Use   Smoking status: Never   Smokeless tobacco: Never  Vaping Use   Vaping status: Never Used  Substance Use Topics   Alcohol use: No   Drug use: No     Allergies   Penicillins, Adalimumab, Leflunomide, Penicillin g, Plaquenil [hydroxychloroquine], Septra  [sulfamethoxazole -trimethoprim ], Sulfamethoxazole , and Trimethoprim    Review of Systems Review of Systems Per HPI  Physical Exam Triage Vital Signs ED Triage Vitals  Encounter Vitals Group     BP 03/01/24 0954 124/79     Systolic BP Percentile --      Diastolic BP Percentile --      Pulse Rate 03/01/24 0954 95     Resp 03/01/24 0954 14     Temp 03/01/24 0954 99 F (37.2 C)     Temp Source 03/01/24 0954 Oral     SpO2 03/01/24 0954 95 %     Weight --      Height --      Head Circumference --      Peak Flow --      Pain Score 03/01/24 0957 3     Pain Loc --      Pain Education --      Exclude from Growth Chart --    No data found.  Updated Vital Signs BP 124/79 (BP Location: Right Arm)   Pulse 95   Temp 99 F (37.2 C) (Oral)   Resp 14   LMP 02/27/2024 (Approximate)   SpO2 95%   Visual Acuity Right Eye Distance:   Left Eye Distance:   Bilateral Distance:    Right Eye Near:   Left Eye Near:    Bilateral Near:     Physical Exam Vitals and nursing note reviewed.  Constitutional:      General: She is not in acute distress.    Appearance: Normal appearance.  HENT:     Head: Normocephalic.     Salivary Glands: Left salivary gland is diffusely enlarged and tender.     Right  Ear: Tympanic membrane, ear  canal and external ear normal.     Left Ear: Tympanic membrane, ear canal and external ear normal.     Nose: Nose normal.     Mouth/Throat:     Mouth: Mucous membranes are moist.  Eyes:     Extraocular Movements: Extraocular movements intact.     Conjunctiva/sclera: Conjunctivae normal.     Pupils: Pupils are equal, round, and reactive to light.  Cardiovascular:     Rate and Rhythm: Normal rate and regular rhythm.     Pulses: Normal pulses.     Heart sounds: Normal heart sounds.  Pulmonary:     Effort: Pulmonary effort is normal. No respiratory distress.     Breath sounds: Normal breath sounds. No stridor. No wheezing or rhonchi.  Abdominal:     General: Bowel sounds are normal.     Palpations: Abdomen is soft.     Tenderness: There is no abdominal tenderness.  Musculoskeletal:     Cervical back: Normal range of motion.  Lymphadenopathy:     Head:     Right side of head: No preauricular adenopathy.     Left side of head: Preauricular adenopathy present.     Cervical: No cervical adenopathy.  Skin:    General: Skin is warm and dry.  Neurological:     General: No focal deficit present.     Mental Status: She is alert and oriented to person, place, and time.  Psychiatric:        Mood and Affect: Mood normal.        Behavior: Behavior normal.      UC Treatments / Results  Labs (all labs ordered are listed, but only abnormal results are displayed) Labs Reviewed - No data to display  EKG   Radiology No results found.  Procedures Procedures (including critical care time)  Medications Ordered in UC Medications - No data to display  Initial Impression / Assessment and Plan / UC Course  I have reviewed the triage vital signs and the nursing notes.  Pertinent labs & imaging results that were available during my care of the patient were reviewed by me and considered in my medical decision making (see chart for details).  On exam, patient  with preauricular lymph node swelling, she also has swelling noted to the salivary gland.  Symptoms consistent with sialoadenitis.  Will treat with Cleocin  300 mg for the next 10 days.  Supportive care recommendations were provided and discussed with the patient to include over-the-counter analgesics, warm compresses to the affected area, and to suck on sour candy to help with swelling in the face.  Patient advised to follow-up with her PCP if symptoms fail to improve.  Patient was in agreement with this plan of care and verbalized understanding.  All questions were answered.  Patient stable for discharge.  Work note was provided.   Final Clinical Impressions(s) / UC Diagnoses   Final diagnoses:  None   Discharge Instructions   None    ED Prescriptions   None    PDMP not reviewed this encounter.   Hardy Lia, NP 03/04/24 1536

## 2024-04-27 ENCOUNTER — Other Ambulatory Visit: Payer: Self-pay | Admitting: Family Medicine

## 2024-06-16 ENCOUNTER — Other Ambulatory Visit: Payer: Self-pay

## 2024-06-16 DIAGNOSIS — R221 Localized swelling, mass and lump, neck: Secondary | ICD-10-CM

## 2024-08-01 ENCOUNTER — Other Ambulatory Visit: Payer: Self-pay | Admitting: Family Medicine

## 2024-10-24 ENCOUNTER — Ambulatory Visit: Admission: RE | Admit: 2024-10-24 | Discharge: 2024-10-24 | Disposition: A | Discharge status: Home / Self Care

## 2024-10-24 VITALS — BP 123/79 | HR 73 | Temp 98.9°F | Resp 16

## 2024-10-24 DIAGNOSIS — G43919 Migraine, unspecified, intractable, without status migrainosus: Secondary | ICD-10-CM | POA: Diagnosis not present

## 2024-10-24 MED ORDER — KETOROLAC TROMETHAMINE 30 MG/ML IJ SOLN
30.0000 mg | Freq: Once | INTRAMUSCULAR | Status: AC
  Administered 2024-10-24: 30 mg via INTRAMUSCULAR

## 2024-10-24 NOTE — Discharge Instructions (Signed)
 Continue your prescription migraine medications and may additionally take Tylenol , stay well-hydrated and get plenty of rest.  The medication that we gave you today should stay in your system for about 48 hours so avoid taking any ibuprofen  or Aleve for that period of time.  Follow-up with primary care soon as possible for recheck.

## 2024-10-24 NOTE — ED Provider Notes (Signed)
 [facility]    CSN: [id number] Arrival date & time: 10/24/24  1048      History   Chief Complaint Chief Complaint  Patient presents with   Headache    Entered by patient    HPI Emily Love is a 48 y.o. female.   Patient presenting today with 2-week history of significant waxing and waning migraine headache.  She has a history of migraines that feel exactly like this, and takes Topamax  and Maxalt  for these which she states only helps sometimes.  Has been additionally taking ibuprofen  and Tylenol  with minimal relief.  Denies recent head injury, dizziness, visual change, nausea, vomiting, weakness numbness or tingling.    Past Medical History:  Diagnosis Date   Abnormal uterine bleeding (AUB) 01/26/2015   Arthritis    psoriatic   Complication of anesthesia    Fatigue 01/26/2015   Gestational diabetes    History of PCOS 01/26/2015   LLQ pain 01/26/2015   Obesity    PCOS (polycystic ovarian syndrome)    Psoriasis    Spinal headache    both deliveries    Patient Active Problem List   Diagnosis Date Noted   Migraine 08/10/2023   Menorrhagia with regular cycle 07/01/2023   Elevated TSH 12/18/2021   Morbid obesity (HCC) 11/08/2021   Osteoarthritis 11/08/2021   Other long term (current) drug therapy 11/08/2021   Psoriatic arthritis (HCC) 11/08/2021   History of PCOS 01/26/2015    Past Surgical History:  Procedure Laterality Date   CARPAL TUNNEL RELEASE Right 2000   CESAREAN SECTION  8007,8005   CESAREAN SECTION WITH BILATERAL TUBAL LIGATION Bilateral 12/30/2017   Procedure: REPEAT CESAREAN SECTION WITH BILATERAL TUBAL LIGATION;  Surgeon: Edsel Norleen GAILS, MD;  Location: [facility];  Service: Obstetrics;  Laterality: Bilateral;   endometrial biopsy     LAPAROSCOPY FOR ECTOPIC PREGNANCY  1997   lumph node removed  2007   TONSILLECTOMY      OB History     Gravida  5   Para  3   Term  3   Preterm      AB  2   Living  3      SAB   1   IAB      Ectopic  1   Multiple      Live Births  3            Home Medications    Prior to Admission medications  Medication Sig Start Date End Date Taking? Authorizing Provider  OTEZLA 30 MG TABS Take 1 tablet by mouth 2 (two) times daily. 09/10/24  Yes [provider]  clobetasol cream (TEMOVATE) 0.05 % Apply topically 2 (two) times daily. 07/18/21   [provider]  ondansetron  (ZOFRAN -ODT) 4 MG disintegrating tablet Take 1 tablet (4 mg total) by mouth every 8 (eight) hours as needed for nausea or vomiting. Patient not taking: Reported on 08/06/2023 06/26/23   Stuart Vernell Norris, PA-C  rizatriptan  (MAXALT -MLT) 10 MG disintegrating tablet Take one tab po at onset of migraine. May repeat in 2 hours if needed. Max 2 pills in 24 hours. Patient not taking: Reported on 08/06/2023 07/01/23   Mauro Elveria BROCKS, NP  topiramate  (TOPAMAX ) 50 MG tablet TAKE 1 TABLET BY MOUTH AT BEDTIME FOR MIGRAINE HEADACHE 08/02/24   Cook, Jayce G, DO  ustekinumab  (STELARA ) 90 MG/ML SOSY injection See admin instructions.    [provider]    Family History Family History  Problem  Relation Age of Onset   Hypothyroidism Mother    Diabetes Father    Heart disease Father    Other Father        something wrong with bones   Other Maternal Grandfather        brain tumor   Cancer Paternal Grandmother        liver   Epilepsy Son    Narcolepsy Son     Social History Social History[1]   Allergies   Penicillins, Adalimumab, Leflunomide, Penicillin g, Plaquenil [hydroxychloroquine], Septra  [sulfamethoxazole -trimethoprim ], Sulfamethoxazole , and Trimethoprim    Review of Systems Review of Systems PER HPI  Physical Exam Triage Vital Signs ED Triage Vitals [10/24/24 1137]  Encounter Vitals Group     BP 123/79     Girls Systolic BP Percentile      Girls Diastolic BP Percentile      Boys Systolic BP Percentile      Boys Diastolic BP Percentile      Pulse Rate 73      Resp 16     Temp 98.9 F (37.2 C)     Temp Source Oral     SpO2 97 %     Weight      Height      Head Circumference      Peak Flow      Pain Score 8     Pain Loc      Pain Education      Exclude from Growth Chart    No data found.  Updated Vital Signs BP 123/79 (BP Location: Right Arm)   Pulse 73   Temp 98.9 F (37.2 C) (Oral)   Resp 16   LMP 10/01/2024 (Approximate)   SpO2 97%   Visual Acuity Right Eye Distance:   Left Eye Distance:   Bilateral Distance:    Right Eye Near:   Left Eye Near:    Bilateral Near:     Physical Exam Vitals and nursing note reviewed.  Constitutional:      Appearance: Normal appearance. She is not ill-appearing.  HENT:     Head: Atraumatic.  Eyes:     Extraocular Movements: Extraocular movements intact.     Conjunctiva/sclera: Conjunctivae normal.     Pupils: Pupils are equal, round, and reactive to light.  Cardiovascular:     Rate and Rhythm: Normal rate and regular rhythm.     Heart sounds: Normal heart sounds.  Pulmonary:     Effort: Pulmonary effort is normal.     Breath sounds: Normal breath sounds.  Musculoskeletal:        General: Normal range of motion.     Cervical back: Normal range of motion and neck supple.  Skin:    General: Skin is warm and dry.  Neurological:     General: No focal deficit present.     Mental Status: She is alert and oriented to person, place, and time.     Cranial Nerves: No cranial nerve deficit.     Motor: No weakness.     Gait: Gait normal.  Psychiatric:        Mood and Affect: Mood normal.        Thought Content: Thought content normal.        Judgment: Judgment normal.      UC Treatments / Results  Labs (all labs ordered are listed, but only abnormal results are displayed) Labs Reviewed - No data to display  EKG   Radiology No results found.  Procedures Procedures (  including critical care time)  Medications Ordered in UC Medications  ketorolac  (TORADOL ) 30 MG/ML  injection 30 mg (30 mg Intramuscular Given 10/24/24 1159)    Initial Impression / Assessment and Plan / UC Course  I have reviewed the triage vital signs and the nursing notes.  Pertinent labs & imaging results that were available during my care of the patient were reviewed by me and considered in my medical decision making (see chart for details).     Vitals and exam reassuring today with no focal neurologic deficits or new concerning features.  Will add IM Toradol  to prescription regimen and discussed supportive over-the-counter medications, home care and return precautions.  Follow-up with PCP as soon as possible.  Work note given.  Final Clinical Impressions(s) / UC Diagnoses   Final diagnoses:  Intractable migraine without status migrainosus, unspecified migraine type     Discharge Instructions      Continue your prescription migraine medications and may additionally take Tylenol , stay well-hydrated and get plenty of rest.  The medication that we gave you today should stay in your system for about 48 hours so avoid taking any ibuprofen  or Aleve for that period of time.  Follow-up with primary care soon as possible for recheck.    ED Prescriptions   None    PDMP not reviewed this encounter.    [1]  Social History Tobacco Use   Smoking status: Never   Smokeless tobacco: Never  Vaping Use   Vaping status: Never Used  Substance Use Topics   Alcohol use: No   Drug use: No     Stuart Vernell Norris, PA-C 10/24/24 1216

## 2024-10-24 NOTE — ED Triage Notes (Signed)
 Patient here today with c/o headache X 2 weeks. Patient states that she has a h/o headaches and has had a little nausea. Patient has taken Tylenol  and Advil  with little relief. Patient states that she is not supposed to take a lot of NSAIDS.

## 2024-10-25 ENCOUNTER — Ambulatory Visit: Admitting: Family Medicine

## 2024-10-25 ENCOUNTER — Encounter: Payer: Self-pay | Admitting: Family Medicine

## 2024-10-25 VITALS — BP 118/80 | HR 82 | Temp 98.3°F | Ht 62.0 in | Wt 220.0 lb

## 2024-10-25 DIAGNOSIS — G43909 Migraine, unspecified, not intractable, without status migrainosus: Secondary | ICD-10-CM

## 2024-10-25 DIAGNOSIS — R519 Headache, unspecified: Secondary | ICD-10-CM

## 2024-10-25 MED ORDER — METHOCARBAMOL 500 MG PO TABS: 500.0000 mg | ORAL_TABLET | Freq: Three times a day (TID) | ORAL | 0 refills | Status: AC | PRN

## 2024-10-25 MED ORDER — TOPIRAMATE 50 MG PO TABS: ORAL_TABLET | ORAL | 0 refills | Status: AC

## 2024-10-25 MED ORDER — TRAMADOL HCL 50 MG PO TABS: 50.0000 mg | ORAL_TABLET | Freq: Three times a day (TID) | ORAL | 0 refills | Status: AC | PRN

## 2024-10-25 NOTE — Progress Notes (Signed)
 Patient Office Visit  Assessment & Plan:  Occipital headache -     Methocarbamol ; Take 1 tablet (500 mg total) by mouth every 8 (eight) hours as needed.  Dispense: 30 tablet; Refill: 0 -     traMADol  HCl; Take 1 tablet (50 mg total) by mouth every 8 (eight) hours as needed for up to 5 days.  Dispense: 15 tablet; Refill: 0  Migraine without status migrainosus, not intractable, unspecified migraine type -     Topiramate ; TAKE 1.5 tablets per day  Dispense: 135 tablet; Refill: 0   Assessment and Plan    Occipital neuralgia Headache for two weeks, severe, occipital region, right side. Pain exacerbated by head movement. Previous Toradol  injection provided temporary relief. Topamax  ineffective. Prednisone  not tolerated Increase Topamax  to 75mg  daily until follow-up. - Prescribed Robaxin  as a non-sedating muscle relaxant. - Prescribed tramadol  as needed for pain management. Follow up with PCP if not improving and for regular follow up visits.   Rheumatoid arthritis and psoriatic arthritis No recent exacerbations. - Continue regular follow-up with rheumatologist every 3-6 months.          No follow-ups on file.   Subjective:    Patient ID: Emily Love, female    DOB: [birth year]  Age: 48 y.o. MRN: [id number]  Chief Complaint  Patient presents with   Headache    Pt states she has had a headache for 2 weeks. Pt went to UC yesterday and received a shot. Pt states the shot helped but now it is back.     HPI Discussed the use of AI scribe software for clinical note transcription with the patient, who gave verbal consent to proceed.  History of Present Illness        History of Present Illness Emily Love is a 47 year old female with psoriatic and rheumatoid arthritis who presents with a severe right sided occipital headache.  She has been experiencing a severe headache for the past two weeks, described as different from her typical migraines. The pain is located on  the right side of her neck and the back of her head, with an intensity of 8 to 9 out of 10. Movement, especially turning her head to the right, exacerbates the headache, and it often wakes her up at night. No associated nausea, vomiting, fever, chills, or recent trauma. No recent stress or neck issues reported.  She has been taking Advil  and Tylenol , which provide some relief but do not eliminate the pain. She also takes Topamax  nightly for her migraines, but it has not been effective for this headache. She visited urgent care the previous day and received a Toradol  shot, which provided temporary relief until the evening but then headache returned. It is about 4-5/10 pain level today  She has a history of psoriatic and rheumatoid arthritis, and her medication regimen is currently disrupted. 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 No teeth grinding or congestion. The pain is primarily in the occipital area on the right side and radiates slightly, but there is no numbness or tingling.  Physical Exam MEASUREMENTS: Height- 98%. NECK: Tenderness on right side of neck. Pain on neck flexion, extension, and rotation.  Assessment and Plan Occipital neuralgia Headache for two weeks, severe, occipital region, right side. Pain exacerbated by head movement. Previous Toradol  injection provided temporary relief. Topamax  ineffective. Prednisone  not tolerated Increase Topamax  to 75mg  daily until follow-up. - Prescribed Robaxin   as a non-sedating muscle relaxant. - Prescribed tramadol  as needed for pain management. Follow up with PCP if not improving and for regular follow up visits.   Rheumatoid arthritis and psoriatic arthritis No recent exacerbations. - Continue regular follow-up with rheumatologist every 3-6 months.    The ASCVD Risk score (Arnett DK, et al., 2019) failed to calculate for the following reasons:   Cannot find a previous HDL  lab   Cannot find a previous total cholesterol lab   * - Cholesterol units were assumed  Past Medical History:  Diagnosis Date   Abnormal uterine bleeding (AUB) 01/26/2015   Arthritis    psoriatic   Complication of anesthesia    Fatigue 01/26/2015   Gestational diabetes    History of PCOS 01/26/2015   LLQ pain 01/26/2015   Obesity    PCOS (polycystic ovarian syndrome)    Psoriasis    Spinal headache    both deliveries   Past Surgical History:  Procedure Laterality Date   CARPAL TUNNEL RELEASE Right 2000   CESAREAN SECTION  [id number]   CESAREAN SECTION WITH BILATERAL TUBAL LIGATION Bilateral 12/30/2017   Procedure: REPEAT CESAREAN SECTION WITH BILATERAL TUBAL LIGATION;  Surgeon: Edsel Norleen GAILS, MD;  Location: [facility];  Service: Obstetrics;  Laterality: Bilateral;   endometrial biopsy     LAPAROSCOPY FOR ECTOPIC PREGNANCY  1997   lumph node removed  2007   TONSILLECTOMY     Social History[1] Family History  Problem Relation Age of Onset   Hypothyroidism Mother    Diabetes Father    Heart disease Father    Other Father        something wrong with bones   Other Maternal Grandfather        brain tumor   Cancer Paternal Grandmother        liver   Epilepsy Son    Narcolepsy Son    Allergies[2]  ROS    Objective:    BP 118/80   Pulse 82   Temp 98.3 F (36.8 C)   Ht 5' 2 (1.575 m)   Wt 220 lb (99.8 kg)   LMP 10/01/2024 (Approximate)   SpO2 99%   BMI 40.24 kg/m  BP Readings from Last 3 Encounters:  10/25/24 118/80  10/24/24 123/79  03/01/24 124/79   Wt Readings from Last 3 Encounters:  10/25/24 220 lb (99.8 kg)  08/06/23 222 lb 3.2 oz (100.8 kg)  07/01/23 226 lb 9.6 oz (102.8 kg)    Physical Exam Vitals and nursing note reviewed.  Constitutional:      General: She is not in acute distress.    Appearance: Normal appearance.  HENT:     Head: Normocephalic.     Right Ear: Tympanic membrane, ear canal and external ear normal.     Left  Ear: Tympanic membrane, ear canal and external ear normal.  Eyes:     Extraocular Movements: Extraocular movements intact.     Pupils: Pupils are equal, round, and reactive to light.     Comments: Not photophobic  Neck:     Comments: Tender over right occipital area to palpation Cardiovascular:     Rate and Rhythm: Normal rate and regular rhythm.     Heart sounds: Normal heart sounds.  Pulmonary:     Effort: Pulmonary effort is normal.     Breath sounds: Normal breath sounds.  Musculoskeletal:     Cervical back: Tenderness present. Pain with movement and muscular tenderness present.  Right lower leg: No edema.     Left lower leg: No edema.  Neurological:     General: No focal deficit present.     Mental Status: She is alert and oriented to person, place, and time.  Psychiatric:        Mood and Affect: Mood normal.        Behavior: Behavior normal.      No results found for any visits on 10/25/24.          [1]  Social History Tobacco Use   Smoking status: Never   Smokeless tobacco: Never  Vaping Use   Vaping status: Never Used  Substance Use Topics   Alcohol use: No   Drug use: No  [2]  Allergies Allergen Reactions   Penicillins Anaphylaxis    Can't breath Has patient had a PCN reaction causing immediate rash, facial/tongue/throat swelling, SOB or lightheadedness with hypotension: Yes Has patient had a PCN reaction causing severe rash involving mucus membranes or skin necrosis: No Has patient had a PCN reaction that required hospitalization: No Has patient had a PCN reaction occurring within the last 10 years: No If all of the above answers are NO, then may proceed with Cephalosporin use.    Adalimumab Hives and Other (See Comments)   Leflunomide Other (See Comments)   Penicillin G Other (See Comments)   Plaquenil [Hydroxychloroquine]    Septra  [Sulfamethoxazole -Trimethoprim ] Itching   Sulfamethoxazole     Trimethoprim 

## 2024-11-18 ENCOUNTER — Other Ambulatory Visit (HOSPITAL_COMMUNITY): Payer: Self-pay

## 2024-11-18 DIAGNOSIS — R221 Localized swelling, mass and lump, neck: Secondary | ICD-10-CM

## 2024-11-24 ENCOUNTER — Ambulatory Visit (HOSPITAL_COMMUNITY)
Admission: RE | Admit: 2024-11-24 | Discharge: 2024-11-24 | Disposition: A | Discharge status: Home / Self Care | Source: Ambulatory Visit

## 2024-11-24 DIAGNOSIS — R221 Localized swelling, mass and lump, neck: Secondary | ICD-10-CM | POA: Insufficient documentation
